# Patient Record
Sex: Male | Born: 1965 | Race: Black or African American | Hispanic: No | Marital: Married | Smoking: Current every day smoker
Health system: Southern US, Community
[De-identification: ages and names within clinical notes are randomized; demographics above are authoritative.]

## PROBLEM LIST (undated history)

## (undated) DIAGNOSIS — N289 Disorder of kidney and ureter, unspecified: Secondary | ICD-10-CM

## (undated) DIAGNOSIS — E119 Type 2 diabetes mellitus without complications: Secondary | ICD-10-CM

## (undated) DIAGNOSIS — G8929 Other chronic pain: Secondary | ICD-10-CM

## (undated) DIAGNOSIS — I10 Essential (primary) hypertension: Secondary | ICD-10-CM

## (undated) DIAGNOSIS — F4310 Post-traumatic stress disorder, unspecified: Secondary | ICD-10-CM

## (undated) DIAGNOSIS — K819 Cholecystitis, unspecified: Secondary | ICD-10-CM

## (undated) DIAGNOSIS — F329 Major depressive disorder, single episode, unspecified: Secondary | ICD-10-CM

## (undated) DIAGNOSIS — F209 Schizophrenia, unspecified: Secondary | ICD-10-CM

## (undated) DIAGNOSIS — F32A Depression, unspecified: Secondary | ICD-10-CM

## (undated) DIAGNOSIS — R413 Other amnesia: Secondary | ICD-10-CM

## (undated) DIAGNOSIS — J449 Chronic obstructive pulmonary disease, unspecified: Secondary | ICD-10-CM

## (undated) DIAGNOSIS — E785 Hyperlipidemia, unspecified: Secondary | ICD-10-CM

## (undated) DIAGNOSIS — G4700 Insomnia, unspecified: Secondary | ICD-10-CM

## (undated) HISTORY — DX: Hyperlipidemia, unspecified: E78.5

## (undated) HISTORY — PX: CERVICAL FUSION: SHX112

## (undated) HISTORY — DX: Essential (primary) hypertension: I10

## (undated) HISTORY — PX: SPINAL CORD STIMULATOR IMPLANT: SHX2422

## (undated) HISTORY — DX: Chronic obstructive pulmonary disease, unspecified: J44.9

## (undated) HISTORY — PX: LUMBAR FUSION: SHX111

---
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
## 2018-06-18 ENCOUNTER — Emergency Department (HOSPITAL_COMMUNITY): Payer: Medicare PPO

## 2018-06-18 ENCOUNTER — Observation Stay (HOSPITAL_COMMUNITY)
Admission: EM | Admit: 2018-06-18 | Discharge: 2018-06-19 | Disposition: A | Discharge status: Home / Self Care | Payer: Medicare PPO | Attending: Internal Medicine | Admitting: Internal Medicine

## 2018-06-18 ENCOUNTER — Other Ambulatory Visit: Payer: Self-pay

## 2018-06-18 ENCOUNTER — Encounter (HOSPITAL_COMMUNITY): Payer: Self-pay | Admitting: Emergency Medicine

## 2018-06-18 DIAGNOSIS — E785 Hyperlipidemia, unspecified: Secondary | ICD-10-CM | POA: Diagnosis not present

## 2018-06-18 DIAGNOSIS — F329 Major depressive disorder, single episode, unspecified: Secondary | ICD-10-CM | POA: Insufficient documentation

## 2018-06-18 DIAGNOSIS — E1310 Other specified diabetes mellitus with ketoacidosis without coma: Secondary | ICD-10-CM

## 2018-06-18 DIAGNOSIS — I10 Essential (primary) hypertension: Secondary | ICD-10-CM | POA: Insufficient documentation

## 2018-06-18 DIAGNOSIS — F4310 Post-traumatic stress disorder, unspecified: Secondary | ICD-10-CM | POA: Insufficient documentation

## 2018-06-18 DIAGNOSIS — Z794 Long term (current) use of insulin: Secondary | ICD-10-CM | POA: Diagnosis not present

## 2018-06-18 DIAGNOSIS — I4510 Unspecified right bundle-branch block: Secondary | ICD-10-CM | POA: Insufficient documentation

## 2018-06-18 DIAGNOSIS — R918 Other nonspecific abnormal finding of lung field: Secondary | ICD-10-CM | POA: Insufficient documentation

## 2018-06-18 DIAGNOSIS — F17210 Nicotine dependence, cigarettes, uncomplicated: Secondary | ICD-10-CM | POA: Diagnosis not present

## 2018-06-18 DIAGNOSIS — G8929 Other chronic pain: Secondary | ICD-10-CM | POA: Diagnosis not present

## 2018-06-18 DIAGNOSIS — F0390 Unspecified dementia without behavioral disturbance: Secondary | ICD-10-CM | POA: Insufficient documentation

## 2018-06-18 DIAGNOSIS — Z981 Arthrodesis status: Secondary | ICD-10-CM | POA: Insufficient documentation

## 2018-06-18 DIAGNOSIS — R413 Other amnesia: Secondary | ICD-10-CM | POA: Diagnosis present

## 2018-06-18 DIAGNOSIS — Z1159 Encounter for screening for other viral diseases: Secondary | ICD-10-CM | POA: Insufficient documentation

## 2018-06-18 DIAGNOSIS — R000 Tachycardia, unspecified: Secondary | ICD-10-CM | POA: Diagnosis not present

## 2018-06-18 DIAGNOSIS — Z79899 Other long term (current) drug therapy: Secondary | ICD-10-CM | POA: Insufficient documentation

## 2018-06-18 DIAGNOSIS — Z7951 Long term (current) use of inhaled steroids: Secondary | ICD-10-CM | POA: Insufficient documentation

## 2018-06-18 DIAGNOSIS — F209 Schizophrenia, unspecified: Secondary | ICD-10-CM | POA: Diagnosis not present

## 2018-06-18 DIAGNOSIS — R109 Unspecified abdominal pain: Secondary | ICD-10-CM | POA: Diagnosis present

## 2018-06-18 DIAGNOSIS — Z720 Tobacco use: Secondary | ICD-10-CM | POA: Diagnosis present

## 2018-06-18 DIAGNOSIS — K5900 Constipation, unspecified: Secondary | ICD-10-CM | POA: Insufficient documentation

## 2018-06-18 DIAGNOSIS — E1110 Type 2 diabetes mellitus with ketoacidosis without coma: Principal | ICD-10-CM | POA: Insufficient documentation

## 2018-06-18 DIAGNOSIS — G4700 Insomnia, unspecified: Secondary | ICD-10-CM | POA: Insufficient documentation

## 2018-06-18 DIAGNOSIS — E0810 Diabetes mellitus due to underlying condition with ketoacidosis without coma: Secondary | ICD-10-CM

## 2018-06-18 DIAGNOSIS — I152 Hypertension secondary to endocrine disorders: Secondary | ICD-10-CM | POA: Diagnosis present

## 2018-06-18 HISTORY — DX: Post-traumatic stress disorder, unspecified: F43.10

## 2018-06-18 HISTORY — DX: Other amnesia: R41.3

## 2018-06-18 HISTORY — DX: Type 2 diabetes mellitus without complications: E11.9

## 2018-06-18 HISTORY — DX: Insomnia, unspecified: G47.00

## 2018-06-18 HISTORY — DX: Other chronic pain: G89.29

## 2018-06-18 HISTORY — DX: Depression, unspecified: F32.A

## 2018-06-18 HISTORY — DX: Disorder of kidney and ureter, unspecified: N28.9

## 2018-06-18 HISTORY — DX: Schizophrenia, unspecified: F20.9

## 2018-06-18 HISTORY — DX: Cholecystitis, unspecified: K81.9

## 2018-06-18 LAB — URINALYSIS, ROUTINE W REFLEX MICROSCOPIC
Bacteria, UA: NONE SEEN
Bilirubin Urine: NEGATIVE
Glucose, UA: >=500 mg/dL — AB
Hgb urine dipstick: NEGATIVE
Ketones, ur: 20 mg/dL — AB
Leukocytes,Ua: NEGATIVE
Nitrite: NEGATIVE
Protein, ur: NEGATIVE mg/dL
Specific Gravity, Urine: 1.035 — ABNORMAL HIGH (ref 1.005–1.030)
pH: 5 (ref 5.0–8.0)

## 2018-06-18 LAB — GLUCOSE, CAPILLARY: Glucose-Capillary: 183 mg/dL — ABNORMAL HIGH (ref 70–99)

## 2018-06-18 LAB — POCT I-STAT EG7
Acid-base deficit: 1 mmol/L (ref 0.0–2.0)
Bicarbonate: 24.5 mmol/L (ref 20.0–28.0)
Calcium, Ion: 1.15 mmol/L (ref 1.15–1.40)
HCT: 41 % (ref 39.0–52.0)
Hemoglobin: 13.9 g/dL (ref 13.0–17.0)
O2 Saturation: 77 %
Potassium: 4.7 mmol/L (ref 3.5–5.1)
Sodium: 131 mmol/L — ABNORMAL LOW (ref 135–145)
TCO2: 26 mmol/L (ref 22–32)
pCO2, Ven: 41.7 mmHg — ABNORMAL LOW (ref 44.0–60.0)
pH, Ven: 7.378 (ref 7.250–7.430)
pO2, Ven: 43 mmHg (ref 32.0–45.0)

## 2018-06-18 LAB — CBG MONITORING, ED
Glucose-Capillary: 482 mg/dL — ABNORMAL HIGH (ref 70–99)
Glucose-Capillary: 508 mg/dL (ref 70–99)
Glucose-Capillary: 384 mg/dL — ABNORMAL HIGH (ref 70–99)
Glucose-Capillary: 290 mg/dL — ABNORMAL HIGH (ref 70–99)
Glucose-Capillary: 250 mg/dL — ABNORMAL HIGH (ref 70–99)
Glucose-Capillary: 222 mg/dL — ABNORMAL HIGH (ref 70–99)
Glucose-Capillary: 236 mg/dL — ABNORMAL HIGH (ref 70–99)

## 2018-06-18 LAB — COMPREHENSIVE METABOLIC PANEL
ALT: 23 U/L (ref 0–44)
ALT: 20 U/L (ref 0–44)
AST: 24 U/L (ref 15–41)
AST: 16 U/L (ref 15–41)
Albumin: 4.1 g/dL (ref 3.5–5.0)
Albumin: 3.4 g/dL — ABNORMAL LOW (ref 3.5–5.0)
Alkaline Phosphatase: 199 U/L — ABNORMAL HIGH (ref 38–126)
Alkaline Phosphatase: 158 U/L — ABNORMAL HIGH (ref 38–126)
Anion gap: 17 — ABNORMAL HIGH (ref 5–15)
Anion gap: 11 (ref 5–15)
BUN: 18 mg/dL (ref 6–20)
BUN: 14 mg/dL (ref 6–20)
CO2: 19 mmol/L — ABNORMAL LOW (ref 22–32)
CO2: 21 mmol/L — ABNORMAL LOW (ref 22–32)
Calcium: 9.3 mg/dL (ref 8.9–10.3)
Calcium: 8.5 mg/dL — ABNORMAL LOW (ref 8.9–10.3)
Chloride: 96 mmol/L — ABNORMAL LOW (ref 98–111)
Chloride: 100 mmol/L (ref 98–111)
Creatinine, Ser: 1.23 mg/dL (ref 0.61–1.24)
Creatinine, Ser: 0.78 mg/dL (ref 0.61–1.24)
GFR calc Af Amer: >60 mL/min (ref >60)
GFR calc Af Amer: >60 mL/min (ref >60)
GFR calc non Af Amer: >60 mL/min (ref >60)
GFR calc non Af Amer: >60 mL/min (ref >60)
Glucose, Bld: 488 mg/dL — ABNORMAL HIGH (ref 70–99)
Glucose, Bld: 260 mg/dL — ABNORMAL HIGH (ref 70–99)
Potassium: 4.9 mmol/L (ref 3.5–5.1)
Potassium: 3.6 mmol/L (ref 3.5–5.1)
Sodium: 132 mmol/L — ABNORMAL LOW (ref 135–145)
Sodium: 132 mmol/L — ABNORMAL LOW (ref 135–145)
Total Bilirubin: 1.3 mg/dL — ABNORMAL HIGH (ref 0.3–1.2)
Total Bilirubin: 0.5 mg/dL (ref 0.3–1.2)
Total Protein: 7.7 g/dL (ref 6.5–8.1)
Total Protein: 6.8 g/dL (ref 6.5–8.1)

## 2018-06-18 LAB — CBC
HCT: 41.1 % (ref 39.0–52.0)
Hemoglobin: 15 g/dL (ref 13.0–17.0)
MCH: 31.6 pg (ref 26.0–34.0)
MCHC: 36.5 g/dL — ABNORMAL HIGH (ref 30.0–36.0)
MCV: 86.7 fL (ref 80.0–100.0)
Platelets: 314 10*3/uL (ref 150–400)
RBC: 4.74 MIL/uL (ref 4.22–5.81)
RDW: 12.4 % (ref 11.5–15.5)
WBC: 11.3 10*3/uL — ABNORMAL HIGH (ref 4.0–10.5)
nRBC: 0 % (ref 0.0–0.2)

## 2018-06-18 LAB — SARS CORONAVIRUS 2 BY RT PCR (HOSPITAL ORDER, PERFORMED IN ~~LOC~~ HOSPITAL LAB): SARS Coronavirus 2: NEGATIVE

## 2018-06-18 LAB — LIPASE, BLOOD: Lipase: 34 U/L (ref 11–51)

## 2018-06-18 MED ORDER — SODIUM CHLORIDE 0.9 % IV BOLUS
1000.0000 mL | Freq: Once | INTRAVENOUS | Status: AC
  Administered 2018-06-18: 1000 mL via INTRAVENOUS

## 2018-06-18 MED ORDER — POTASSIUM CHLORIDE 10 MEQ/100ML IV SOLN
10.0000 meq | INTRAVENOUS | Status: AC
  Administered 2018-06-18 (×2): 10 meq via INTRAVENOUS
  Filled 2018-06-18 (×2): qty 100

## 2018-06-18 MED ORDER — INSULIN REGULAR(HUMAN) IN NACL 100-0.9 UT/100ML-% IV SOLN: INTRAVENOUS | Status: DC

## 2018-06-18 MED ORDER — DEXTROSE-NACL 5-0.45 % IV SOLN: INTRAVENOUS | Status: DC

## 2018-06-18 MED ORDER — DEXTROSE-NACL 5-0.45 % IV SOLN
INTRAVENOUS | Status: DC
  Administered 2018-06-18: 21:00:00 via INTRAVENOUS

## 2018-06-18 MED ORDER — FENTANYL CITRATE (PF) 100 MCG/2ML IJ SOLN
50.0000 ug | Freq: Once | INTRAMUSCULAR | Status: AC
  Administered 2018-06-18: 50 ug via INTRAVENOUS
  Filled 2018-06-18: qty 2

## 2018-06-18 MED ORDER — MORPHINE SULFATE (PF) 4 MG/ML IV SOLN
4.0000 mg | Freq: Once | INTRAVENOUS | Status: AC
  Administered 2018-06-18: 4 mg via INTRAVENOUS
  Filled 2018-06-18: qty 1

## 2018-06-18 MED ORDER — SODIUM CHLORIDE 0.9 % IV SOLN: INTRAVENOUS | Status: DC

## 2018-06-18 MED ORDER — ENOXAPARIN SODIUM 40 MG/0.4ML ~~LOC~~ SOLN
40.0000 mg | SUBCUTANEOUS | Status: DC
  Administered 2018-06-19: 40 mg via SUBCUTANEOUS
  Filled 2018-06-18: qty 0.4

## 2018-06-18 MED ORDER — SODIUM CHLORIDE 0.9 % IV SOLN
INTRAVENOUS | Status: DC
  Administered 2018-06-18: 18:00:00 via INTRAVENOUS

## 2018-06-18 MED ORDER — ONDANSETRON HCL 4 MG/2ML IJ SOLN
4.0000 mg | Freq: Once | INTRAMUSCULAR | Status: AC
  Administered 2018-06-18: 4 mg via INTRAVENOUS
  Filled 2018-06-18: qty 2

## 2018-06-18 MED ORDER — INSULIN REGULAR(HUMAN) IN NACL 100-0.9 UT/100ML-% IV SOLN
INTRAVENOUS | Status: DC
  Administered 2018-06-18: 4.5 [IU]/h via INTRAVENOUS
  Filled 2018-06-18: qty 100

## 2018-06-18 NOTE — ED Provider Notes (Signed)
[facility] EMERGENCY DEPARTMENT Provider Note   CSN: [id number] Arrival date & time: 06/18/18  1413    History   Chief Complaint Chief Complaint  Patient presents with   Abdominal Pain   Hyperglycemia    HPI Bryan Carlson is a 53 y.o. male with PMH/o Cholecystitis, Depression, Chronic pain, DM, Schizophrenia, PTSD who presents for evaluation of progressively worsening chronic abdominal pain.  Patient states he has had abdominal pain for the last 8 months.  He was previously seen in [place] where he got majority of his care.  Wife states that they they saw evidence of gallstones on his last scan about 5 months ago but had not talked about getting his gallbladder taken out.  Patient and his wife recently moved to [place] in March 2020.  They had not seen anybody for care because of COVID-19 pandemic.  Patient states that for the last month, he is felt like his abdominal pain has progressively worsened.  He states he takes Tylenol at home but does not have any improvement in pain.  Patient states that he has not had much of an appetite secondary to pain.  He reports that he has had nausea/vomiting for the last 8 months.  He states that most recent episode of vomiting was last night and that it was clear.  He does not know what color the vomit has been the other times.  Patient does not think he has had any coffee-ground emesis.  His last bowel movement was earlier this morning.  No presence of blood.  Patient states that he has also had some worsening lower back pain.  He has a history of chronic back pain and has had surgery.  He states that he has chronic numbness/weakness of his extremities as well as some chronic incontinence.  He states that there has been no new changes.  He has not noted any fevers.  Patient denies any chest pain, difficulty breathing.  Patient also reports he has been out of his diabetes medications for the last 2 weeks.  He reports he normally takes  metformin, glipizide and Lantus at home but states he has not had a chance to refill his medication since he ran out.  Patient denies any current chest pain, difficulty breathing, new numbness/weakness of his extremities, fevers.  He states that they have been self quarantine.  He does not know of any COVID-19 exposure.     The history is provided by the patient.    Past Medical History:  Diagnosis Date   Cholecystitis    Chronic pain    Depression    Diabetes mellitus without complication ([place])    Insomnia    Memory loss    PTSD (post-traumatic stress disorder)    Renal disorder    Schizophrenia [facility])     Patient Active Problem List   Diagnosis Date Noted   DKA (diabetic ketoacidoses) ([place]) 06/18/2018   Abdominal pain 06/18/2018   Essential hypertension 06/18/2018   Dementia ([place]) 06/18/2018    Past Surgical History:  Procedure Laterality Date   CERVICAL FUSION     LUMBAR FUSION          Home Medications    Prior to Admission medications   Medication Sig Start Date End Date Taking? Authorizing Provider  atorvastatin (LIPITOR) 40 MG tablet Take 40 mg by mouth daily.   Yes [provider]  cloNIDine (CATAPRES) 0.1 MG tablet Take 0.05 mg by mouth 2 (two) times daily.  Yes [provider]  cloZAPine (CLOZARIL) 100 MG tablet Take 400 mg by mouth at bedtime.   Yes [provider]  diphenhydrAMINE HCl 50 MG/30ML LIQD Take 50 mg by mouth at bedtime.   Yes [provider]  fluticasone (FLONASE) 50 MCG/ACT nasal spray Place 1 spray into both nostrils daily.   Yes [provider]  gabapentin (NEURONTIN) 800 MG tablet Take 800 mg by mouth 3 (three) times daily.   Yes [provider]  glipiZIDE (GLUCOTROL) 5 MG tablet Take 5 mg by mouth daily before breakfast.   Yes [provider]  insulin glargine (LANTUS) 100 unit/mL SOPN Inject 15 Units into the skin daily.   Yes [provider]    lisinopril (ZESTRIL) 5 MG tablet Take 5 mg by mouth daily.   Yes [provider]  Melatonin 10 MG TABS Take 20 mg by mouth at bedtime.   Yes [provider]  metFORMIN (GLUCOPHAGE) 1000 MG tablet Take 1,000 mg by mouth 2 (two) times daily with a meal.   Yes [provider]  Omega-3 Fatty Acids (FISH OIL PO) Take 1 capsule by mouth daily.   Yes [provider]  omeprazole (PRILOSEC) 40 MG capsule Take 40 mg by mouth daily.   Yes [provider]  prazosin (MINIPRESS) 5 MG capsule Take 5 mg by mouth at bedtime.   Yes [provider]  sertraline (ZOLOFT) 100 MG tablet Take 100 mg by mouth daily.   Yes [provider]  sertraline (ZOLOFT) 50 MG tablet Take 50 mg by mouth daily.   Yes [provider]  traZODone (DESYREL) 100 MG tablet Take 100 mg by mouth at bedtime.   Yes [provider]    Family History Family History  Problem Relation Age of Onset   Hypertension Mother    Diabetes Father     Social History Social History   Tobacco Use   Smoking status: Current Every Day Smoker    Packs/day: 1.50    Types: Cigarettes   Smokeless tobacco: Never Used  Substance Use Topics   Alcohol use: Not Currently   Drug use: Not Currently    Types: Cocaine     Allergies   Contrast media [iodinated diagnostic agents]   Review of Systems Review of Systems  Constitutional: Positive for appetite change. Negative for fever.  Respiratory: Negative for cough and shortness of breath.   Cardiovascular: Negative for chest pain.  Gastrointestinal: Positive for abdominal pain, nausea and vomiting.  Genitourinary: Negative for dysuria and hematuria.  Musculoskeletal: Positive for back pain.  Neurological: Positive for numbness (Chronic). Negative for headaches.  All other systems reviewed and are negative.    Physical Exam Updated Vital Signs BP 115/76    Pulse (!) 104    Temp 98.8 F (37.1 C) (Oral)     Resp 16    Ht _0  (1.753 m)    Wt 83.9 kg    SpO2 97%    BMI 27.32 kg/m   Physical Exam Vitals signs and nursing note reviewed.  Constitutional:      Appearance: Normal appearance. He is well-developed.  HENT:     Head: Normocephalic and atraumatic.  Eyes:     General: Lids are normal.     Conjunctiva/sclera: Conjunctivae normal.     Pupils: Pupils are equal, round, and reactive to light.  Neck:     Musculoskeletal: Full passive range of motion without pain.  Cardiovascular:     Rate and  Rhythm: Normal rate and regular rhythm.     Pulses: Normal pulses.          Radial pulses are 2+ on the right side and 2+ on the left side.       Dorsalis pedis pulses are 2+ on the right side and 2+ on the left side.     Heart sounds: Normal heart sounds. No murmur. No friction rub. No gallop.   Pulmonary:     Effort: Pulmonary effort is normal.     Breath sounds: Normal breath sounds.     Comments: No evidence of respiratory distress.  Able to speak in full sentences without any difficulty.  Diffuse rhonchi noted. Abdominal:     General: Bowel sounds are normal.     Palpations: Abdomen is soft. Abdomen is not rigid.     Tenderness: There is generalized abdominal tenderness and tenderness in the right upper quadrant. There is no guarding.     Comments: Abdomen is soft, nondistended.  Good bowel sounds.  He has some generalized tenderness diffusely throughout but most notably in the right upper quadrant.  No CVA tenderness.  Musculoskeletal: Normal range of motion.       Back:     Comments: Well-healed surgical incision scar noted to lower lumbar region.  Diffuse tenderness over the entire lumbar region.  No deformity or crepitus noted.  Skin:    General: Skin is warm and dry.     Capillary Refill: Capillary refill takes less than 2 seconds.  Neurological:     Mental Status: He is alert and oriented to person, place, and time.     Comments: Follows commands, Moves all extremities  5/5  strength to BUE and BLE  Decreased sensation noted to tips of bilateral fingers. Sensation intact throughout all major nerve distributions  Psychiatric:        Speech: Speech normal.      ED Treatments / Results  Labs (all labs ordered are listed, but only abnormal results are displayed) Labs Reviewed  COMPREHENSIVE METABOLIC PANEL - Abnormal; Notable for the following components:      Result Value   Sodium 132 (*)    Chloride 96 (*)    CO2 19 (*)    Glucose, Bld 488 (*)    Alkaline Phosphatase 199 (*)    Total Bilirubin 1.3 (*)    Anion gap 17 (*)    All other components within normal limits  CBC - Abnormal; Notable for the following components:   WBC 11.3 (*)    MCHC 36.5 (*)    All other components within normal limits  URINALYSIS, ROUTINE W REFLEX MICROSCOPIC - Abnormal; Notable for the following components:   Color, Urine STRAW (*)    Specific Gravity, Urine 1.035 (*)    Glucose, UA >=500 (*)    Ketones, ur 20 (*)    All other components within normal limits  CBG MONITORING, ED - Abnormal; Notable for the following components:   Glucose-Capillary 482 (*)    All other components within normal limits  CBG MONITORING, ED - Abnormal; Notable for the following components:   Glucose-Capillary 508 (*)    All other components within normal limits  POCT I-STAT EG7 - Abnormal; Notable for the following components:   pCO2, Ven 41.7 (*)    Sodium 131 (*)    All other components within normal limits  CBG MONITORING, ED - Abnormal; Notable for the following components:   Glucose-Capillary 384 (*)    All  other components within normal limits  CBG MONITORING, ED - Abnormal; Notable for the following components:   Glucose-Capillary 290 (*)    All other components within normal limits  CBG MONITORING, ED - Abnormal; Notable for the following components:   Glucose-Capillary 250 (*)    All other components within normal limits  CBG MONITORING, ED - Abnormal; Notable for the  following components:   Glucose-Capillary 222 (*)    All other components within normal limits  CBG MONITORING, ED - Abnormal; Notable for the following components:   Glucose-Capillary 236 (*)    All other components within normal limits  SARS CORONAVIRUS 2 (HOSPITAL ORDER, [facility])  LIPASE, BLOOD  COMPREHENSIVE METABOLIC PANEL    EKG None  Radiology Dg Chest 2 View  Result Date: 06/18/2018 CLINICAL DATA:  Hypertension.  Pain. EXAM: CHEST - 2 VIEW COMPARISON:  None. FINDINGS: Lungs are clear. Heart size and pulmonary vascularity are normal. No adenopathy. There is postoperative change in the lower cervical region. No pneumothorax. IMPRESSION: No edema or consolidation. Electronically Signed   By: Lowella Grip III M.D.   On: 06/18/2018 19:25   US Abdomen Limited Ruq  Result Date: 06/18/2018 CLINICAL DATA:  Upper abdominal pain EXAM: ULTRASOUND ABDOMEN LIMITED RIGHT UPPER QUADRANT COMPARISON:  None. FINDINGS: Gallbladder: No gallstones or wall thickening visualized. There is no pericholecystic fluid. No sonographic Murphy sign noted by sonographer. Common bile duct: Diameter: 8 mm proximally with tapering the 6 mm. No biliary duct mass or calculus. No evident intrahepatic biliary duct dilatation. Liver: No focal lesion identified. Within normal limits in parenchymal echogenicity. Portal vein is patent on color Doppler imaging with normal direction of blood flow towards the liver. IMPRESSION: Mildly prominent proximal common bile duct with mild tapering distally. No biliary duct mass or calculus evident by ultrasound. Study otherwise unremarkable. Electronically Signed   By: Lowella Grip III M.D.   On: 06/18/2018 19:24    Procedures .Critical Care Performed by: Volanda Napoleon, PA-C Authorized by: Volanda Napoleon, PA-C   Critical care provider statement:    Critical care time (minutes):  40   Critical care was necessary to treat or prevent  imminent or life-threatening deterioration of the following conditions:  Metabolic crisis   Critical care was time spent personally by me on the following activities:  Discussions with consultants, evaluation of patient's response to treatment, examination of patient, ordering and performing treatments and interventions, ordering and review of laboratory studies, ordering and review of radiographic studies, pulse oximetry, re-evaluation of patient's condition, obtaining history from patient or surrogate and review of old charts   (including critical care time)  Medications Ordered in ED Medications  insulin regular, human (MYXREDLIN) 100 units/ 100 mL infusion (7 Units/hr Intravenous Rate/Dose Change 06/18/18 2243)  sodium chloride 0.9 % bolus 1,000 mL (0 mLs Intravenous Stopped 06/18/18 1819)    And  0.9 %  sodium chloride infusion ( Intravenous Stopped 06/18/18 2114)  dextrose 5 %-0.45 % sodium chloride infusion ( Intravenous Transfusing/Transfer 06/18/18 2251)  morphine 4 MG/ML injection 4 mg (4 mg Intravenous Given 06/18/18 1733)  ondansetron (ZOFRAN) injection 4 mg (4 mg Intravenous Given 06/18/18 1733)  potassium chloride 10 mEq in 100 mL IVPB (0 mEq Intravenous Stopped 06/18/18 2114)  fentaNYL (SUBLIMAZE) injection 50 mcg (50 mcg Intravenous Given 06/18/18 2127)     Initial Impression / Assessment and Plan / ED Course  I have reviewed the triage vital signs and the nursing notes.  Pertinent labs & imaging results that were available during my care of the patient were reviewed by me and considered in my medical decision making (see chart for details).        53 year old male past history of cholelithiasis, diabetes, chronic pain who presents for evaluation of progressively worsening abdominal pain.  Also reports he has been out of his diabetes medications for 2 weeks.  No fevers, chest pain, difficulty breathing. Patient is afebrile, non-toxic appearing, sitting comfortably on examination  table. Vital signs reviewed and stable.  On exam, he has tenderness noted to the right upper quadrant.  Consider hepatobiliary etiology  COVID negative.  UA does show ketones and glucose.  CBC shows leukocytosis of 11.3.  CMP shows bicarb of 19, glucose of 488, alk phos of 199, total bili of 1.3, anion gap of 17.  No priors for comparison.  Workup consistent with mild DKA.  Lab work reassuring.  Abdomen benign with no peritoneal signs.  No indication for CT imaging of abdomen.  Chest x-ray negative for any infectious etiology.  Ultrasound shows no evidence of cholecystitis.  There is mention of a mildly prominent proximal common bile duct with mild tapering distally.  No evidence of any other abnormalities.  Blood glucose after fluids and insulin is 290.  Plan for admission for DKA. Discussed patient with Dr. Ellender Hose who is agreeable.   Discussed patient with Dr. Roel Cluck (hospitalist). Will admit.   Portions of this note were generated with [employer]. Dictation errors may occur despite best attempts at proofreading.   Final Clinical Impressions(s) / ED Diagnoses   Final diagnoses:  Abdominal pain  Diabetic ketoacidosis without coma associated with other specified diabetes mellitus [facility])    ED Discharge Orders    None       Desma Mcgregor 06/18/18 2311    Duffy Bruce, MD 06/21/18 1019

## 2018-06-18 NOTE — ED Notes (Signed)
Patient transported to Ultrasound 

## 2018-06-18 NOTE — ED Notes (Signed)
ED Provider at bedside. 

## 2018-06-18 NOTE — ED Triage Notes (Signed)
Pt reports being out of his diabetes medication going on 2 weeks. Pt also states he is having a gallbladder flare up.

## 2018-06-18 NOTE — Progress Notes (Signed)
Pt admitted to 4E04. CHG bath completed, tele applied. Pt c/o tender stomach. Pt oriented to room, call bell and phone within reach.   Pt c/o intermittent CP; 4/10. EKG obtained. MD paged. Will continue to monitor.   Reynold Bowen, RN BSN 06/18/2018 11:45 PM

## 2018-06-18 NOTE — H&P (Addendum)
Bryan Carlson QQP:[id number] DOB: 07-03-1965 DOA: 06/18/2018     PCP: System, Pcp Not In   Outpatient Specialists: NONE    Patient arrived to ER on 06/18/18 at 1413  Patient coming from: home Lives  With family    Chief Complaint:  Chief Complaint  Patient presents with   Abdominal Pain   Hyperglycemia    HPI: Bryan Carlson is a 53 y.o. male with medical history significant of DM2   Cholecystitis, Depression, Chronic pain Schizophrenia, PTSD   Presented with   for evaluation of progressively worsening chronic abdominal pain No abdominal pain for 8 months in [place] he had a ultrasound done about 5 months ago that showed gallstones.  Patient his wife moved to [place] in March 2020 and has not been seen anybody for this since then secondary to pandemic. Have not established care The pain has been getting progressively worse To take Tylenol but does not seem to help. Have any worsening appetite  Has been having intermittent nausea vomitng and abdominal pain reports abdominal distention and constipation  Fever  or shortness of breath  He has been out of his diabetes medications for the last 2 weeks He is supposed to be on metformin and glipizide and Lantus Reports dizziness,    chest pain after cough  no shortness of breath, no fever Cough that has been chronicn  Infectious risk factors:  Reports   N/V  In RAPID COVID TEST NEGATIVE     Regarding pertinent Chronic problems:     Hyperlipidemia -  Not on statins   HTN on clonidine    DM 2 - on insulin, PO meds         While in ER: Noted to have bicarb 19 glucose 488 anion gap of 17 ketones in the urine diagnosis of DKA Chest x-ray unremarkable ultrasound showing no cholecystitis He was given IV fluids and insulin his blood sugar came down to 290  The following Work up has been ordered so far:  Orders Placed This Encounter  Procedures   Critical Care   SARS Coronavirus 2 (CEPHEID - Performed in [place] hospital lab), Hosp Order   US Abdomen Limited RUQ   DG Chest 2 View   CT ABDOMEN PELVIS WO CONTRAST   Lipase, blood   Comprehensive metabolic panel   CBC   Urinalysis, Routine w reflex microscopic   Comprehensive metabolic panel   HIV antibody (Routine Testing)   Basic metabolic panel   Beta-hydroxybutyric acid   Urine rapid drug screen (hosp performed)not at [facility]   Hemoglobin A1c   Glucose, capillary   Magnesium   Phosphorus   TSH   Comprehensive metabolic panel   CBC   Glucose, capillary   Diet NPO time specified   Saline Lock IV, Maintain IV access   Saline Lock IV, Maintain IV access   Cardiac monitoring   Initiate Carrier Fluid Protocol   K+  > 5 mEq/L = no potassium   Vital signs   Cardiac monitoring   Notify physician   Bed rest with bathroom privileges   Please refer to DKA Protocol sidebar report   Please refer to Hypoglycemia sidebar report   Strict intake and output   Initiate Oral Care Protocol   Initiate Carrier Fluid Protocol   RN may order General Admission PRN Orders utilizing "General Admission PRN medications" (through manage orders) for the following patient needs: allergy symptoms (Claritin), cold sores (Carmex), cough (Robitussin DM), eye irritation (Liquifilm  Tears), hemorrhoids (Tucks), indigestion (Maalox), minor skin irritation (Hydrocortisone Cream), muscle pain Suezanne Jacquet Gay), nose irritation (saline nasal spray) and sore throat (Chloraseptic spray).   IV bolus initiated in ED   K+  > 5 mEq/L = no potassium   Vital signs   Notify physician   Up with assistance   If patient diabetic or glucose greater than 140 notify physician for Sliding Scale Insulin Orders   May go off telemetry for tests/procedures   Oral care per nursing protocol   Initiate Oral Care Protocol   Initiate Carrier Fluid Protocol   RN may order General Admission PRN Orders utilizing "General Admission PRN medications" (through  manage orders) for the following patient needs: allergy symptoms (Claritin), cold sores (Carmex), cough (Robitussin DM), eye irritation (Liquifilm Tears), hemorrhoids (Tucks), indigestion (Maalox), minor skin irritation (Hydrocortisone Cream), muscle pain Suezanne Jacquet Gay), nose irritation (saline nasal spray) and sore throat (Chloraseptic spray).   Patient has an active order for admit to inpatient/place in observation   Full code   Consult for [facility] Admission   Consult for [facility] Admission   Pulse oximetry, continuous   Pulse oximetry check with vital signs   Oxygen therapy Mode or (Route): Nasal cannula; Liters Per Minute: 2; Keep 02 saturation: greater than 92 %   Incentive spirometry   CBG monitoring, ED   CBG monitoring, ED   POCT I-Stat EG7   CBG monitoring, ED   CBG monitoring, ED   CBG monitoring, ED   CBG monitoring, ED   CBG monitoring, ED   EKG 12-Lead   EKG 12-Lead   EKG 12-Lead   EKG 12-Lead   EKG 12-Lead   Place in observation (patient's expected length of stay will be less than 2 midnights)   Following Medications were ordered in ER: Medications  insulin regular, human (MYXREDLIN) 100 units/ 100 mL infusion (6.2 Units/hr Intravenous Handoff 06/19/18 0012)  sodium chloride 0.9 % bolus 1,000 mL (0 mLs Intravenous Stopped 06/18/18 1819)    And  0.9 %  sodium chloride infusion ( Intravenous Stopped 06/18/18 2114)  dextrose 5 %-0.45 % sodium chloride infusion ( Intravenous Transfusing/Transfer 06/18/18 2251)  0.9 %  sodium chloride infusion ( Intravenous Not Given 06/18/18 2344)  enoxaparin (LOVENOX) injection 40 mg (40 mg Subcutaneous Given 06/19/18 0046)  dextrose 5 %-0.45 % sodium chloride infusion ( Intravenous Transfusing/Transfer 06/18/18 2335)  atorvastatin (LIPITOR) tablet 40 mg (has no administration in time range)  cloNIDine (CATAPRES) tablet 0.05 mg (has no administration in time range)  lisinopril (ZESTRIL) tablet 5 mg (has no  administration in time range)  cloZAPine (CLOZARIL) tablet 400 mg (has no administration in time range)  sertraline (ZOLOFT) tablet 100 mg (has no administration in time range)  sertraline (ZOLOFT) tablet 50 mg (has no administration in time range)  traZODone (DESYREL) tablet 100 mg (has no administration in time range)  pantoprazole (PROTONIX) EC tablet 40 mg (has no administration in time range)  gabapentin (NEURONTIN) capsule 800 mg (has no administration in time range)  acetaminophen (TYLENOL) tablet 650 mg (has no administration in time range)    Or  acetaminophen (TYLENOL) suppository 650 mg (has no administration in time range)  ondansetron (ZOFRAN) tablet 4 mg (has no administration in time range)    Or  ondansetron (ZOFRAN) injection 4 mg (has no administration in time range)  docusate sodium (COLACE) capsule 100 mg (has no administration in time range)  bisacodyl (DULCOLAX) suppository 10 mg (has no administration in time range)  morphine  4 MG/ML injection 4 mg (4 mg Intravenous Given 06/18/18 1733)  ondansetron (ZOFRAN) injection 4 mg (4 mg Intravenous Given 06/18/18 1733)  potassium chloride 10 mEq in 100 mL IVPB (0 mEq Intravenous Stopped 06/18/18 2114)  fentaNYL (SUBLIMAZE) injection 50 mcg (50 mcg Intravenous Given 06/18/18 2127)       Significant initial  Findings: Abnormal Labs Reviewed  COMPREHENSIVE METABOLIC PANEL - Abnormal; Notable for the following components:      Result Value   Sodium 132 (*)    Chloride 96 (*)    CO2 19 (*)    Glucose, Bld 488 (*)    Alkaline Phosphatase 199 (*)    Total Bilirubin 1.3 (*)    Anion gap 17 (*)    All other components within normal limits  CBC - Abnormal; Notable for the following components:   WBC 11.3 (*)    MCHC 36.5 (*)    All other components within normal limits  URINALYSIS, ROUTINE W REFLEX MICROSCOPIC - Abnormal; Notable for the following components:   Color, Urine STRAW (*)    Specific Gravity, Urine 1.035 (*)     Glucose, UA >=500 (*)    Ketones, ur 20 (*)    All other components within normal limits  COMPREHENSIVE METABOLIC PANEL - Abnormal; Notable for the following components:   Sodium 132 (*)    CO2 21 (*)    Glucose, Bld 260 (*)    Calcium 8.5 (*)    Albumin 3.4 (*)    Alkaline Phosphatase 158 (*)    All other components within normal limits  BASIC METABOLIC PANEL - Abnormal; Notable for the following components:   Potassium 3.4 (*)    CO2 21 (*)    Glucose, Bld 135 (*)    Calcium 8.7 (*)    All other components within normal limits  RAPID URINE DRUG SCREEN, HOSP PERFORMED - Abnormal; Notable for the following components:   Opiates POSITIVE (*)    All other components within normal limits  HEMOGLOBIN A1C - Abnormal; Notable for the following components:   Hgb A1c MFr Bld 11.6 (*)    All other components within normal limits  GLUCOSE, CAPILLARY - Abnormal; Notable for the following components:   Glucose-Capillary 183 (*)    All other components within normal limits  GLUCOSE, CAPILLARY - Abnormal; Notable for the following components:   Glucose-Capillary 102 (*)    All other components within normal limits  CBG MONITORING, ED - Abnormal; Notable for the following components:   Glucose-Capillary 482 (*)    All other components within normal limits  CBG MONITORING, ED - Abnormal; Notable for the following components:   Glucose-Capillary 508 (*)    All other components within normal limits  POCT I-STAT EG7 - Abnormal; Notable for the following components:   pCO2, Ven 41.7 (*)    Sodium 131 (*)    All other components within normal limits  CBG MONITORING, ED - Abnormal; Notable for the following components:   Glucose-Capillary 384 (*)    All other components within normal limits  CBG MONITORING, ED - Abnormal; Notable for the following components:   Glucose-Capillary 290 (*)    All other components within normal limits  CBG MONITORING, ED - Abnormal; Notable for the following  components:   Glucose-Capillary 250 (*)    All other components within normal limits  CBG MONITORING, ED - Abnormal; Notable for the following components:   Glucose-Capillary 222 (*)    All other components within  normal limits  CBG MONITORING, ED - Abnormal; Notable for the following components:   Glucose-Capillary 236 (*)    All other components within normal limits     Otherwise labs showing:    Recent Labs  Lab 06/18/18 1449 06/18/18 1743 06/18/18 2233 06/19/18 0003  NA 132* 131* 132* 135  K 4.9 4.7 3.6 3.4*  CO2 19*  --  21* 21*  GLUCOSE 488*  --  260* 135*  BUN 18  --  14 14  CREATININE 1.23  --  0.78 0.81  CALCIUM 9.3  --  8.5* 8.7*    Cr   stable,   Lab Results  Component Value Date   CREATININE 0.81 06/19/2018   CREATININE 0.78 06/18/2018   CREATININE 1.23 06/18/2018    Recent Labs  Lab 06/18/18 1449 06/18/18 2233  AST 24 16  ALT 23 20  ALKPHOS 199* 158*  BILITOT 1.3* 0.5  PROT 7.7 6.8  ALBUMIN 4.1 3.4*   Lab Results  Component Value Date   CALCIUM 8.7 (L) 06/19/2018      WBC      Component Value Date/Time   WBC 11.3 (H) 06/18/2018 1449     Plt: Lab Results  Component Value Date   PLT 314 06/18/2018       Venous  Blood Gas result:  pH 7.378  PCO2 41.7   ABG    Component Value Date/Time   HCO3 24.5 06/18/2018 1743   TCO2 26 06/18/2018 1743   ACIDBASEDEF 1.0 06/18/2018 1743   O2SAT 77.0 06/18/2018 1743      HG/HCT  Stable,     Component Value Date/Time   HGB 13.9 06/18/2018 1743   HCT 41.0 06/18/2018 1743    Recent Labs  Lab 06/18/18 1449  LIPASE 34       DM  labs:  HbA1C: Recent Labs    06/19/18 0003  HGBA1C 11.6*       CBG (last 3)  Recent Labs    06/18/18 2239 06/18/18 2342 06/19/18 0046  GLUCAP 236* 183* 102*      UA  no evidence of UTI     Urine analysis:    Component Value Date/Time   COLORURINE STRAW (A) 06/18/2018 1730   APPEARANCEUR CLEAR 06/18/2018 1730   LABSPEC 1.035 (H) 06/18/2018  1730   PHURINE 5.0 06/18/2018 1730   GLUCOSEU >=500 (A) 06/18/2018 1730   HGBUR NEGATIVE 06/18/2018 1730   BILIRUBINUR NEGATIVE 06/18/2018 1730   KETONESUR 20 (A) 06/18/2018 1730   PROTEINUR NEGATIVE 06/18/2018 1730   NITRITE NEGATIVE 06/18/2018 1730   LEUKOCYTESUR NEGATIVE 06/18/2018 1730      CXR - NON acute    [place] not acute  ECG:  Personally reviewed by me showing: HR : 102 Rhythm  Sinus tachycardia    , no evidence of ischemic changes QTC 471      ED Triage Vitals  Enc Vitals Group     BP 06/18/18 1439 132/84     Pulse Rate 06/18/18 1439 (!) 121     Resp 06/18/18 1439 20     Temp 06/18/18 1439 97.7 F (36.5 C)     Temp Source 06/18/18 1439 Oral     SpO2 06/18/18 1439 100 %     Weight 06/18/18 1444 185 lb (83.9 kg)     Height 06/18/18 1444 '5\' 9"'$  (1.753 m)     Head Circumference --      Peak Flow --      Pain Score 06/18/18  1440 8     Pain Loc --      Pain Edu? --      Excl. in [place]? --   TMAX(24)@       Latest  Blood pressure 103/71, pulse (!) 103, temperature 98.8 F (37.1 C), temperature source Oral, resp. rate 12, height '5\' 9"'$  (1.753 m), weight 83.9 kg, SpO2 99 %.    Hospitalist was called for admission for DKA   Review of Systems:    Pertinent positives include: fatigue, abdominal pain, nausea, vomiting  Constitutional:  No weight loss, night sweats, Fevers, chills, weight loss  HEENT:  No headaches, Difficulty swallowing,Tooth/dental problems,Sore throat,  No sneezing, itching, ear ache, nasal congestion, post nasal drip,  Cardio-vascular:  No chest pain, Orthopnea, PND, anasarca, dizziness, palpitations.no Bilateral lower extremity swelling  GI:  No heartburn, indigestion, , diarrhea, change in bowel habits, loss of appetite, melena, blood in stool, hematemesis Resp:  no shortness of breath at rest. No dyspnea on exertion, No excess mucus, no productive cough, No non-productive cough, No coughing up of blood.No change in color of mucus.No  wheezing. Skin:  no rash or lesions. No jaundice GU:  no dysuria, change in color of urine, no urgency or frequency. No straining to urinate.  No flank pain.  Musculoskeletal:  No joint pain or no joint swelling. No decreased range of motion. No back pain.  Psych:  No change in mood or affect. No depression or anxiety. No memory loss.  Neuro: no localizing neurological complaints, no tingling, no weakness, no double vision, no gait abnormality, no slurred speech, no confusion  All systems reviewed and apart from [place] all are negative  Past Medical History:   Past Medical History:  Diagnosis Date   Cholecystitis    Chronic pain    Depression    Diabetes mellitus without complication (HCC)    Insomnia    Memory loss    PTSD (post-traumatic stress disorder)    Renal disorder    Schizophrenia ([place])       Past Surgical History:  Procedure Laterality Date   CERVICAL FUSION     LUMBAR FUSION      Social History:  Ambulatory   Independently      reports that he has been smoking cigarettes. He has been smoking about 1.50 packs per day. He has never used smokeless tobacco. He reports previous alcohol use. He reports previous drug use. Drug: Cocaine.     Family History:   Family History  Problem Relation Age of Onset   Hypertension Mother    Diabetes Father     Allergies: Allergies  Allergen Reactions   Contrast Media [Iodinated Diagnostic Agents] Hives     Prior to Admission medications   Not on File   Physical Exam: Blood pressure 103/71, pulse (!) 103, temperature 98.8 F (37.1 C), temperature source Oral, resp. rate 12, height '5\' 9"'$  (1.753 m), weight 83.9 kg, SpO2 99 %. 1. General:  in No Acute distress    Chronically ill  -appearing 2. Psychological: Alert and   Oriented 3. Head/ENT:     Dry Mucous Membranes                          Head Non traumatic, neck supple                            Poor Dentition 4. SKIN:  decreased Skin turgor,  Skin clean Dry and intact no rash 5. Heart: Regular rate and rhythm no  Murmur, no Rub or gallop 6. Lungs:  Clear to auscultation bilaterally, no wheezes or crackles   7. Abdomen: Soft, generalized tenderness,    distended   Obese bowel sounds present 8. Lower extremities: no clubbing, cyanosis, no  edema 9. Neurologically Grossly intact, moving all 4 extremities equally  10. MSK: Normal range of motion   All other LABS:     Recent Labs  Lab 06/18/18 1449 06/18/18 1743  WBC 11.3*  --   HGB 15.0 13.9  HCT 41.1 41.0  MCV 86.7  --   PLT 314  --      Recent Labs  Lab 06/18/18 1449 06/18/18 1743 06/18/18 2233 06/19/18 0003  NA 132* 131* 132* 135  K 4.9 4.7 3.6 3.4*  CL 96*  --  100 105  CO2 19*  --  21* 21*  GLUCOSE 488*  --  260* 135*  BUN 18  --  14 14  CREATININE 1.23  --  0.78 0.81  CALCIUM 9.3  --  8.5* 8.7*     Recent Labs  Lab 06/18/18 1449 06/18/18 2233  AST 24 16  ALT 23 20  ALKPHOS 199* 158*  BILITOT 1.3* 0.5  PROT 7.7 6.8  ALBUMIN 4.1 3.4*       Cultures: No results found for: SDES, SPECREQUEST, CULT, REPTSTATUS   Radiological Exams on Admission: Dg Chest 2 View  Result Date: 06/18/2018 CLINICAL DATA:  Hypertension.  Pain. EXAM: CHEST - 2 VIEW COMPARISON:  None. FINDINGS: Lungs are clear. Heart size and pulmonary vascularity are normal. No adenopathy. There is postoperative change in the lower cervical region. No pneumothorax. IMPRESSION: No edema or consolidation. Electronically Signed   By: Lowella Grip III M.D.   On: 06/18/2018 19:25   US Abdomen Limited Ruq  Result Date: 06/18/2018 CLINICAL DATA:  Upper abdominal pain EXAM: ULTRASOUND ABDOMEN LIMITED RIGHT UPPER QUADRANT COMPARISON:  None. FINDINGS: Gallbladder: No gallstones or wall thickening visualized. There is no pericholecystic fluid. No sonographic Murphy sign noted by sonographer. Common bile duct: Diameter: 8 mm proximally with tapering the 6 mm. No biliary duct mass or calculus.  No evident intrahepatic biliary duct dilatation. Liver: No focal lesion identified. Within normal limits in parenchymal echogenicity. Portal vein is patent on color Doppler imaging with normal direction of blood flow towards the liver. IMPRESSION: Mildly prominent proximal common bile duct with mild tapering distally. No biliary duct mass or calculus evident by ultrasound. Study otherwise unremarkable. Electronically Signed   By: Lowella Grip III M.D.   On: 06/18/2018 19:24    Chart has been reviewed    Assessment/Plan   53 y.o. male with medical history significant of DM2   Cholecystitis, Depression, Chronic pain Schizophrenia, PTSD Admitted for DKA in the setting of noncompliance with home insulin regimen  Present on Admission:  DKA (diabetic ketoacidoses) ([place]) -mild anticipate be able to transition to home insulin regimen.  Continue serial B met IV fluid resuscitation order diabetes coordinator consult given elevated hemoglobin A1c   Abdominal pain -ultrasound nonconclusive but no evidence of cholecystitis.  Given diffuse abdominal pain constipation and weight loss will obtain CT of abdomen to further evaluate for any other underlying abnormality   Essential hypertension -stable continue home medications  Dementia (HCC)-mild patient currently stable we will continue to monitor may have some degree of sundowning  History of depression - continue home medication  Tobacco abuse -  -  Spoke about importance of quitting spent 5 minutes discussing options for treatment, prior attempts at quitting, and dangers of smoking  -At this point patient is    interested in quitting  - order nicotine patch   - nursing tobacco cessation protocol  Other plan as per orders.  DVT prophylaxis:    Lovenox     Code Status:  FULL CODE   Family Communication:   Family not at  Bedside    Disposition Plan:    To home once workup is complete and patient is stable                                     Diabetes coordinator Consults called: none  Admission status:  ED Disposition    ED Disposition Condition [place]: [place] [[id number]]  Level of Care: Progressive [102]  I expect the patient will be discharged within 24 hours: No (not a candidate for 5C-Observation unit)  Covid Evaluation: N/A  Diagnosis: DKA (diabetic ketoacidoses) ([place]) [[id number]]  Admitting Physician: Toy Baker [[id number]]  Attending Physician: Toy Baker [[id number]]  PT Class (Do Not Modify): Observation [104]  PT Acc Code (Do Not Modify): Observation [10022]         Obs    Level of care       SDU tele indefinitely please discontinue once patient no longer qualifies  Precautions:  No active isolations  PPE: Used by the provider:   P100  eye Goggles,  Gloves      Kirandeep Fariss 06/19/2018, 1:01 AM    [facility]     after 2 AM please page floor coverage PA If 7AM-7PM, please contact the day team taking care of the patient using [email or web address]

## 2018-06-18 NOTE — ED Notes (Signed)
ED TO INPATIENT HANDOFF REPORT  ED Nurse Name and Phone #: Elmarie Shileyiffany [phone]  S Name/Age/Gender Bryan Labrumavid Novacek 53 y.o. male Room/Bed: 028C/028C  Code Status   Code Status: Not on file  Home/SNF/Other Patient oriented to: self, place, time and situation Is this baseline? Yes   Triage Complete: Triage complete  Chief Complaint blood sugar high/gallbladder  Triage Note Pt reports being out of his diabetes medication going on 2 weeks. Pt also states he is having a gallbladder flare up.    Allergies Allergies  Allergen Reactions  . Contrast Media [Iodinated Diagnostic Agents] Hives    Level of Care/Admitting Diagnosis ED Disposition    ED Disposition Condition Comment   Admit  [facility] [[id number]]  Level of Care: Progressive [102]  I expect the patient will be discharged within 24 hours: No (not a candidate for 5C-Observation unit)  Covid Evaluation: N/A  Diagnosis: DKA (diabetic ketoacidoses) (HCC) [454098][193956]  Admitting Physician: Therisa DoyneUTOVA, ANASTASSIA [[id number]]  Attending Physician: Therisa DoyneUTOVA, ANASTASSIA [[id number]]  PT Class (Do Not Modify): Observation [104]  PT Acc Code (Do Not Modify): Observation [10022]       B Medical/Surgery History Past Medical History:  Diagnosis Date  . Cholecystitis   . Chronic pain   . Depression   . Diabetes mellitus without complication (HCC)   . Insomnia   . Memory loss   . PTSD (post-traumatic stress disorder)   . Renal disorder   . Schizophrenia [facility](HCC)    Past Surgical History:  Procedure Laterality Date  . CERVICAL FUSION    . LUMBAR FUSION       A IV Location/Drains/Wounds Patient Lines/Drains/Airways Status   Active Line/Drains/Airways    Name:   Placement date:   Placement time:   Site:   Days:   Peripheral IV 06/18/18 Right Antecubital   06/18/18    1657    Antecubital   less than 1   Peripheral IV 06/18/18 Left Antecubital   06/18/18    2041    Antecubital   less than 1           Intake/Output Last 24 hours  Intake/Output Summary (Last 24 hours) at 06/18/2018 2207 Last data filed at 06/18/2018 2114 Gross per 24 hour  Intake 849.35 ml  Output -  Net 849.35 ml    Labs/Imaging Results for orders placed or performed during the hospital encounter of 06/18/18 (from the past 48 hour(s))  CBG monitoring, ED     Status: Abnormal   Collection Time: 06/18/18  2:39 PM  Result Value Ref Range   Glucose-Capillary 482 (H) 70 - 99 mg/dL   Comment 1 Notify RN    Comment 2 Document in Chart   Lipase, blood     Status: None   Collection Time: 06/18/18  2:49 PM  Result Value Ref Range   Lipase 34 11 - 51 U/L    Comment: Performed at [facility], [address] [place], [place] [address]  Comprehensive metabolic panel     Status: Abnormal   Collection Time: 06/18/18  2:49 PM  Result Value Ref Range   Sodium 132 (L) 135 - 145 mmol/L    Comment: POST-ULTRACENTRIFUGATION   Potassium 4.9 3.5 - 5.1 mmol/L    Comment: HEMOLYSIS AT THIS LEVEL MAY AFFECT RESULT   Chloride 96 (L) 98 - 111 mmol/L   CO2 19 (L) 22 - 32 mmol/L   Glucose, Bld 488 (H) 70 - 99 mg/dL   BUN 18  6 - 20 mg/dL   Creatinine, Ser 9.60 0.61 - 1.24 mg/dL   Calcium 9.3 8.9 - 45.4 mg/dL   Total Protein 7.7 6.5 - 8.1 g/dL   Albumin 4.1 3.5 - 5.0 g/dL   AST 24 15 - 41 U/L   ALT 23 0 - 44 U/L   Alkaline Phosphatase 199 (H) 38 - 126 U/L   Total Bilirubin 1.3 (H) 0.3 - 1.2 mg/dL   GFR calc non Af Amer >60 >60 mL/min   GFR calc Af Amer >60 >60 mL/min   Anion gap 17 (H) 5 - 15    Comment: Performed at [facility], [address] [place], [place] [address]  CBC     Status: Abnormal   Collection Time: 06/18/18  2:49 PM  Result Value Ref Range   WBC 11.3 (H) 4.0 - 10.5 K/uL   RBC 4.74 4.22 - 5.81 MIL/uL   Hemoglobin 15.0 13.0 - 17.0 g/dL   HCT 91.4 78.2 - 95.6 %   MCV 86.7 80.0 - 100.0 fL   MCH 31.6 26.0 - 34.0 pg   MCHC 36.5 (H) 30.0 - 36.0 g/dL   RDW 21.3 08.6 - 57.8 %   Platelets 314 150  - 400 K/uL   nRBC 0.0 0.0 - 0.2 %    Comment: Performed at [facility], [address], [place], [place] [address]  CBG monitoring, ED     Status: Abnormal   Collection Time: 06/18/18  5:13 PM  Result Value Ref Range   Glucose-Capillary 508 (HH) 70 - 99 mg/dL   Comment 1 Notify RN   Urinalysis, Routine w reflex microscopic     Status: Abnormal   Collection Time: 06/18/18  5:30 PM  Result Value Ref Range   Color, Urine STRAW (A) YELLOW   APPearance CLEAR CLEAR   Specific Gravity, Urine 1.035 (H) 1.005 - 1.030   pH 5.0 5.0 - 8.0   Glucose, UA >=500 (A) NEGATIVE mg/dL   Hgb urine dipstick NEGATIVE NEGATIVE   Bilirubin Urine NEGATIVE NEGATIVE   Ketones, ur 20 (A) NEGATIVE mg/dL   Protein, ur NEGATIVE NEGATIVE mg/dL   Nitrite NEGATIVE NEGATIVE   Leukocytes,Ua NEGATIVE NEGATIVE   RBC / HPF 0-5 0 - 5 RBC/hpf   WBC, UA 0-5 0 - 5 WBC/hpf   Bacteria, UA NONE SEEN NONE SEEN    Comment: Performed at [facility], [address], [place], [place] [address]  POCT I-Stat EG7     Status: Abnormal   Collection Time: 06/18/18  5:43 PM  Result Value Ref Range   pH, Ven 7.378 7.250 - 7.430   pCO2, Ven 41.7 (L) 44.0 - 60.0 mmHg   pO2, Ven 43.0 32.0 - 45.0 mmHg   Bicarbonate 24.5 20.0 - 28.0 mmol/L   TCO2 26 22 - 32 mmol/L   O2 Saturation 77.0 %   Acid-base deficit 1.0 0.0 - 2.0 mmol/L   Sodium 131 (L) 135 - 145 mmol/L   Potassium 4.7 3.5 - 5.1 mmol/L   Calcium, Ion 1.15 1.15 - 1.40 mmol/L   HCT 41.0 39.0 - 52.0 %   Hemoglobin 13.9 13.0 - 17.0 g/dL   Patient temperature HIDE    Sample type VENOUS   SARS Coronavirus 2 (CEPHEID - Performed in [facility] hospital lab), Hosp Order     Status: None   Collection Time: 06/18/18  5:49 PM  Result Value Ref Range   SARS Coronavirus 2 NEGATIVE NEGATIVE  Comment: (NOTE) If result is NEGATIVE SARS-CoV-2 target nucleic acids are NOT DETECTED. The SARS-CoV-2 RNA is generally detectable in upper and lower  respiratory specimens during  the acute phase of infection. The lowest  concentration of SARS-CoV-2 viral copies this assay can detect is 250  copies / mL. A negative result does not preclude SARS-CoV-2 infection  and should not be used as the sole basis for treatment or other  patient management decisions.  A negative result may occur with  improper specimen collection / handling, submission of specimen other  than nasopharyngeal swab, presence of viral mutation(s) within the  areas targeted by this assay, and inadequate number of viral copies  (<250 copies / mL). A negative result must be combined with clinical  observations, patient history, and epidemiological information. If result is POSITIVE SARS-CoV-2 target nucleic acids are DETECTED. The SARS-CoV-2 RNA is generally detectable in upper and lower  respiratory specimens dur ing the acute phase of infection.  Positive  results are indicative of active infection with SARS-CoV-2.  Clinical  correlation with patient history and other diagnostic information is  necessary to determine patient infection status.  Positive results do  not rule out bacterial infection or co-infection with other viruses. If result is PRESUMPTIVE POSTIVE SARS-CoV-2 nucleic acids MAY BE PRESENT.   A presumptive positive result was obtained on the submitted specimen  and confirmed on repeat testing.  While 2019 novel coronavirus  (SARS-CoV-2) nucleic acids may be present in the submitted sample  additional confirmatory testing may be necessary for epidemiological  and / or clinical management purposes  to differentiate between  SARS-CoV-2 and other Sarbecovirus currently known to infect humans.  If clinically indicated additional testing with an alternate test  methodology [phone]) is advised. The SARS-CoV-2 RNA is generally  detectable in upper and lower respiratory sp ecimens during the acute  phase of infection. The expected result is Negative. Fact Sheet for Patients:   BoilerBrush.com.cy Fact Sheet for Healthcare Providers: https://pope.com/ This test is not yet approved or cleared by the [place] FDA and has been authorized for detection and/or diagnosis of SARS-CoV-2 by FDA under an Emergency Use Authorization (EUA).  This EUA will remain in effect (meaning this test can be used) for the duration of the COVID-19 declaration under Section 564(b)(1) of the Act, 21 U.S.C. section 360bbb-3(b)(1), unless the authorization is terminated or revoked sooner. Performed at [facility], [address] [place], [place] [address]   CBG monitoring, ED     Status: Abnormal   Collection Time: 06/18/18  6:16 PM  Result Value Ref Range   Glucose-Capillary 384 (H) 70 - 99 mg/dL  CBG monitoring, ED     Status: Abnormal   Collection Time: 06/18/18  7:25 PM  Result Value Ref Range   Glucose-Capillary 290 (H) 70 - 99 mg/dL  CBG monitoring, ED     Status: Abnormal   Collection Time: 06/18/18  8:26 PM  Result Value Ref Range   Glucose-Capillary 250 (H) 70 - 99 mg/dL   Comment 1 Notify RN    Comment 2 Document in Chart   CBG monitoring, ED     Status: Abnormal   Collection Time: 06/18/18  9:32 PM  Result Value Ref Range   Glucose-Capillary 222 (H) 70 - 99 mg/dL   Comment 1 Document in Chart    Dg Chest 2 View  Result Date: 06/18/2018 CLINICAL DATA:  Hypertension.  Pain. EXAM: CHEST - 2 VIEW COMPARISON:  None. FINDINGS: Lungs  are clear. Heart size and pulmonary vascularity are normal. No adenopathy. There is postoperative change in the lower cervical region. No pneumothorax. IMPRESSION: No edema or consolidation. Electronically Signed   By: Bretta Bang III M.D.   On: 06/18/2018 19:25   US Abdomen Limited Ruq  Result Date: 06/18/2018 CLINICAL DATA:  Upper abdominal pain EXAM: ULTRASOUND ABDOMEN LIMITED RIGHT UPPER QUADRANT COMPARISON:  None. FINDINGS: Gallbladder: No gallstones or wall thickening  visualized. There is no pericholecystic fluid. No sonographic Murphy sign noted by sonographer. Common bile duct: Diameter: 8 mm proximally with tapering the 6 mm. No biliary duct mass or calculus. No evident intrahepatic biliary duct dilatation. Liver: No focal lesion identified. Within normal limits in parenchymal echogenicity. Portal vein is patent on color Doppler imaging with normal direction of blood flow towards the liver. IMPRESSION: Mildly prominent proximal common bile duct with mild tapering distally. No biliary duct mass or calculus evident by ultrasound. Study otherwise unremarkable. Electronically Signed   By: Bretta Bang III M.D.   On: 06/18/2018 19:24    Pending Labs Unresulted Labs (From admission, onward)    Start     Ordered   06/18/18 2152  Comprehensive metabolic panel  ONCE - STAT,   STAT     06/18/18 2151   Signed and Held  HIV antibody (Routine Testing)  Tomorrow morning,   R     Signed and Held   Signed and Held  Basic metabolic panel  STAT Now then every 4 hours ,   STAT     Signed and Held   Signed and Held  Beta-hydroxybutyric acid  Add-on,   R     Signed and Held   Signed and Held  Urine rapid drug screen (hosp performed)not at [employer] [facility]  Add-on,   R     Signed and Held   Signed and Held  Hemoglobin A1c  Add-on,   R     Signed and Held          Vitals/Pain Today's Vitals   06/18/18 2100 06/18/18 2114 06/18/18 2137 06/18/18 2145  BP:   (!) 137/96 115/70  Pulse: (!) 103  (!) 101 (!) 104  Resp: 12  (!) 21 15  Temp:      TempSrc:      SpO2: 99%  98% 98%  Weight:      Height:      PainSc:  7       Isolation Precautions No active isolations  Medications Medications  insulin regular, human (MYXREDLIN) 100 units/ 100 mL infusion (4.9 Units/hr Intravenous Rate/Dose Change 06/18/18 2136)  sodium chloride 0.9 % bolus 1,000 mL (0 mLs Intravenous Stopped 06/18/18 1819)    And  0.9 %  sodium chloride infusion ( Intravenous Stopped 06/18/18 2114)  dextrose 5  %-0.45 % sodium chloride infusion ( Intravenous New Bag/Given 06/18/18 2042)  morphine 4 MG/ML injection 4 mg (4 mg Intravenous Given 06/18/18 1733)  ondansetron (ZOFRAN) injection 4 mg (4 mg Intravenous Given 06/18/18 1733)  potassium chloride 10 mEq in 100 mL IVPB (0 mEq Intravenous Stopped 06/18/18 2114)  fentaNYL (SUBLIMAZE) injection 50 mcg (50 mcg Intravenous Given 06/18/18 2127)    Mobility walks with device Low fall risk   Focused Assessments GI/GU assessment   R Recommendations: See Admitting Provider Note  Report given to:   Additional Notes: Insulin drip at 4.9 units/hr.  Last CBG was 222

## 2018-06-19 ENCOUNTER — Observation Stay (HOSPITAL_COMMUNITY): Payer: Medicare PPO

## 2018-06-19 ENCOUNTER — Encounter (HOSPITAL_COMMUNITY): Payer: Self-pay | Admitting: Internal Medicine

## 2018-06-19 DIAGNOSIS — Z720 Tobacco use: Secondary | ICD-10-CM | POA: Diagnosis present

## 2018-06-19 DIAGNOSIS — R109 Unspecified abdominal pain: Secondary | ICD-10-CM | POA: Diagnosis not present

## 2018-06-19 DIAGNOSIS — E1110 Type 2 diabetes mellitus with ketoacidosis without coma: Secondary | ICD-10-CM | POA: Diagnosis not present

## 2018-06-19 DIAGNOSIS — E0810 Diabetes mellitus due to underlying condition with ketoacidosis without coma: Secondary | ICD-10-CM | POA: Diagnosis not present

## 2018-06-19 DIAGNOSIS — Z1159 Encounter for screening for other viral diseases: Secondary | ICD-10-CM | POA: Diagnosis not present

## 2018-06-19 DIAGNOSIS — E1310 Other specified diabetes mellitus with ketoacidosis without coma: Secondary | ICD-10-CM | POA: Diagnosis not present

## 2018-06-19 DIAGNOSIS — F329 Major depressive disorder, single episode, unspecified: Secondary | ICD-10-CM | POA: Diagnosis not present

## 2018-06-19 LAB — COMPREHENSIVE METABOLIC PANEL
ALT: 18 U/L (ref 0–44)
AST: 16 U/L (ref 15–41)
Albumin: 3.3 g/dL — ABNORMAL LOW (ref 3.5–5.0)
Alkaline Phosphatase: 141 U/L — ABNORMAL HIGH (ref 38–126)
Anion gap: 10 (ref 5–15)
BUN: 11 mg/dL (ref 6–20)
CO2: 23 mmol/L (ref 22–32)
Calcium: 8.7 mg/dL — ABNORMAL LOW (ref 8.9–10.3)
Chloride: 104 mmol/L (ref 98–111)
Creatinine, Ser: 0.75 mg/dL (ref 0.61–1.24)
GFR calc Af Amer: >60 mL/min (ref >60)
GFR calc non Af Amer: >60 mL/min (ref >60)
Glucose, Bld: 144 mg/dL — ABNORMAL HIGH (ref 70–99)
Potassium: 3.3 mmol/L — ABNORMAL LOW (ref 3.5–5.1)
Sodium: 137 mmol/L (ref 135–145)
Total Bilirubin: 0.6 mg/dL (ref 0.3–1.2)
Total Protein: 6.6 g/dL (ref 6.5–8.1)

## 2018-06-19 LAB — BASIC METABOLIC PANEL
Anion gap: 11 (ref 5–15)
Anion gap: 9 (ref 5–15)
BUN: 11 mg/dL (ref 6–20)
BUN: 14 mg/dL (ref 6–20)
CO2: 21 mmol/L — ABNORMAL LOW (ref 22–32)
CO2: 22 mmol/L (ref 22–32)
Calcium: 8.7 mg/dL — ABNORMAL LOW (ref 8.9–10.3)
Calcium: 8.7 mg/dL — ABNORMAL LOW (ref 8.9–10.3)
Chloride: 103 mmol/L (ref 98–111)
Chloride: 105 mmol/L (ref 98–111)
Creatinine, Ser: 0.67 mg/dL (ref 0.61–1.24)
Creatinine, Ser: 0.81 mg/dL (ref 0.61–1.24)
GFR calc Af Amer: >60 mL/min (ref >60)
GFR calc Af Amer: >60 mL/min (ref >60)
GFR calc non Af Amer: >60 mL/min (ref >60)
GFR calc non Af Amer: >60 mL/min (ref >60)
Glucose, Bld: 135 mg/dL — ABNORMAL HIGH (ref 70–99)
Glucose, Bld: 194 mg/dL — ABNORMAL HIGH (ref 70–99)
Potassium: 3.4 mmol/L — ABNORMAL LOW (ref 3.5–5.1)
Potassium: 3.5 mmol/L (ref 3.5–5.1)
Sodium: 135 mmol/L (ref 135–145)
Sodium: 136 mmol/L (ref 135–145)

## 2018-06-19 LAB — CBC
HCT: 36.5 % — ABNORMAL LOW (ref 39.0–52.0)
Hemoglobin: 12.8 g/dL — ABNORMAL LOW (ref 13.0–17.0)
MCH: 30.4 pg (ref 26.0–34.0)
MCHC: 35.1 g/dL (ref 30.0–36.0)
MCV: 86.7 fL (ref 80.0–100.0)
Platelets: 237 10*3/uL (ref 150–400)
RBC: 4.21 MIL/uL — ABNORMAL LOW (ref 4.22–5.81)
RDW: 12.6 % (ref 11.5–15.5)
WBC: 7.8 10*3/uL (ref 4.0–10.5)
nRBC: 0 % (ref 0.0–0.2)

## 2018-06-19 LAB — GLUCOSE, CAPILLARY
Glucose-Capillary: 102 mg/dL — ABNORMAL HIGH (ref 70–99)
Glucose-Capillary: 110 mg/dL — ABNORMAL HIGH (ref 70–99)
Glucose-Capillary: 110 mg/dL — ABNORMAL HIGH (ref 70–99)
Glucose-Capillary: 132 mg/dL — ABNORMAL HIGH (ref 70–99)
Glucose-Capillary: 144 mg/dL — ABNORMAL HIGH (ref 70–99)
Glucose-Capillary: 152 mg/dL — ABNORMAL HIGH (ref 70–99)
Glucose-Capillary: 156 mg/dL — ABNORMAL HIGH (ref 70–99)
Glucose-Capillary: 248 mg/dL — ABNORMAL HIGH (ref 70–99)

## 2018-06-19 LAB — RAPID URINE DRUG SCREEN, HOSP PERFORMED
Amphetamines: NOT DETECTED
Barbiturates: NOT DETECTED
Benzodiazepines: NOT DETECTED
Cocaine: NOT DETECTED
Opiates: POSITIVE — AB
Tetrahydrocannabinol: NOT DETECTED

## 2018-06-19 LAB — TSH: TSH: 0.879 u[IU]/mL (ref 0.350–4.500)

## 2018-06-19 LAB — HIV ANTIBODY (ROUTINE TESTING W REFLEX): HIV Screen 4th Generation wRfx: NONREACTIVE

## 2018-06-19 LAB — HEMOGLOBIN A1C
Hgb A1c MFr Bld: 11.4 % — ABNORMAL HIGH (ref 4.8–5.6)
Hgb A1c MFr Bld: 11.6 % — ABNORMAL HIGH (ref 4.8–5.6)
Mean Plasma Glucose: 280.48 mg/dL
Mean Plasma Glucose: 286.22 mg/dL

## 2018-06-19 LAB — BETA-HYDROXYBUTYRIC ACID: Beta-Hydroxybutyric Acid: 0.14 mmol/L (ref 0.05–0.27)

## 2018-06-19 LAB — MAGNESIUM: Magnesium: 2.1 mg/dL (ref 1.7–2.4)

## 2018-06-19 LAB — PHOSPHORUS: Phosphorus: 2.7 mg/dL (ref 2.5–4.6)

## 2018-06-19 MED ORDER — GABAPENTIN 800 MG PO TABS: 800.0000 mg | ORAL_TABLET | Freq: Three times a day (TID) | ORAL | 1 refills | Status: AC

## 2018-06-19 MED ORDER — POLYETHYLENE GLYCOL 3350 17 G PO PACK: 17.0000 g | PACK | Freq: Every day | ORAL | 0 refills | Status: AC | PRN

## 2018-06-19 MED ORDER — BISACODYL 10 MG RE SUPP: 10.0000 mg | Freq: Every day | RECTAL | Status: DC | PRN

## 2018-06-19 MED ORDER — ONDANSETRON HCL 4 MG/2ML IJ SOLN: 4.0000 mg | Freq: Four times a day (QID) | INTRAMUSCULAR | Status: DC | PRN

## 2018-06-19 MED ORDER — INSULIN GLARGINE 100 UNIT/ML ~~LOC~~ SOLN
15.0000 [IU] | Freq: Every day | SUBCUTANEOUS | Status: DC
  Administered 2018-06-19: 15 [IU] via SUBCUTANEOUS
  Filled 2018-06-19: qty 0.15

## 2018-06-19 MED ORDER — INSULIN ASPART 100 UNIT/ML ~~LOC~~ SOLN
0.0000 [IU] | Freq: Three times a day (TID) | SUBCUTANEOUS | Status: DC
  Administered 2018-06-19: 3 [IU] via SUBCUTANEOUS

## 2018-06-19 MED ORDER — GABAPENTIN 400 MG PO CAPS
800.0000 mg | ORAL_CAPSULE | Freq: Three times a day (TID) | ORAL | Status: DC
  Administered 2018-06-19: 800 mg via ORAL
  Filled 2018-06-19: qty 2

## 2018-06-19 MED ORDER — CLONIDINE HCL 0.1 MG PO TABS
0.0500 mg | ORAL_TABLET | Freq: Two times a day (BID) | ORAL | Status: DC
  Administered 2018-06-19 (×2): 0.05 mg via ORAL
  Filled 2018-06-19 (×2): qty 1

## 2018-06-19 MED ORDER — ACETAMINOPHEN 325 MG PO TABS
650.0000 mg | ORAL_TABLET | Freq: Four times a day (QID) | ORAL | Status: DC | PRN
  Administered 2018-06-19: 650 mg via ORAL
  Filled 2018-06-19: qty 2

## 2018-06-19 MED ORDER — PRAZOSIN HCL 5 MG PO CAPS: 5.0000 mg | ORAL_CAPSULE | Freq: Every day | ORAL | 3 refills | Status: AC

## 2018-06-19 MED ORDER — LISINOPRIL 5 MG PO TABS
5.0000 mg | ORAL_TABLET | Freq: Every day | ORAL | Status: DC
  Administered 2018-06-19: 5 mg via ORAL
  Filled 2018-06-19: qty 1

## 2018-06-19 MED ORDER — ATORVASTATIN CALCIUM 40 MG PO TABS
40.0000 mg | ORAL_TABLET | Freq: Every day | ORAL | Status: DC
  Administered 2018-06-19: 40 mg via ORAL
  Filled 2018-06-19: qty 1

## 2018-06-19 MED ORDER — CLONIDINE HCL 0.1 MG PO TABS: 0.0500 mg | ORAL_TABLET | Freq: Two times a day (BID) | ORAL | 11 refills | Status: AC

## 2018-06-19 MED ORDER — ONDANSETRON HCL 4 MG PO TABS: 4.0000 mg | ORAL_TABLET | Freq: Four times a day (QID) | ORAL | Status: DC | PRN

## 2018-06-19 MED ORDER — SERTRALINE HCL 50 MG PO TABS
50.0000 mg | ORAL_TABLET | Freq: Every day | ORAL | Status: DC
  Administered 2018-06-19: 50 mg via ORAL
  Filled 2018-06-19: qty 1

## 2018-06-19 MED ORDER — INSULIN ASPART 100 UNIT/ML ~~LOC~~ SOLN: 0.0000 [IU] | Freq: Every day | SUBCUTANEOUS | Status: DC

## 2018-06-19 MED ORDER — METFORMIN HCL 1000 MG PO TABS: 1000.0000 mg | ORAL_TABLET | Freq: Two times a day (BID) | ORAL | 5 refills | Status: AC

## 2018-06-19 MED ORDER — INSULIN ASPART 100 UNIT/ML FLEXPEN: PEN_INJECTOR | SUBCUTANEOUS | 11 refills | Status: DC

## 2018-06-19 MED ORDER — GLIPIZIDE 5 MG PO TABS: 5.0000 mg | ORAL_TABLET | Freq: Every day | ORAL | 2 refills | Status: DC

## 2018-06-19 MED ORDER — INSULIN PEN NEEDLE 32G X 4 MM MISC: 15.0000 [IU] | Freq: Every day | 3 refills | Status: AC

## 2018-06-19 MED ORDER — TRAZODONE HCL 100 MG PO TABS
100.0000 mg | ORAL_TABLET | Freq: Every day | ORAL | Status: DC
  Administered 2018-06-19: 100 mg via ORAL
  Filled 2018-06-19: qty 1

## 2018-06-19 MED ORDER — DOCUSATE SODIUM 100 MG PO CAPS
100.0000 mg | ORAL_CAPSULE | Freq: Two times a day (BID) | ORAL | Status: DC
  Administered 2018-06-19 (×2): 100 mg via ORAL
  Filled 2018-06-19 (×2): qty 1

## 2018-06-19 MED ORDER — ATORVASTATIN CALCIUM 40 MG PO TABS: 40.0000 mg | ORAL_TABLET | Freq: Every day | ORAL | 2 refills | Status: AC

## 2018-06-19 MED ORDER — OMEPRAZOLE 40 MG PO CPDR: 40.0000 mg | DELAYED_RELEASE_CAPSULE | Freq: Every day | ORAL | 3 refills | Status: AC

## 2018-06-19 MED ORDER — PANTOPRAZOLE SODIUM 40 MG PO TBEC
40.0000 mg | DELAYED_RELEASE_TABLET | Freq: Every day | ORAL | Status: DC
  Administered 2018-06-19: 40 mg via ORAL
  Filled 2018-06-19: qty 1

## 2018-06-19 MED ORDER — SERTRALINE HCL 100 MG PO TABS
100.0000 mg | ORAL_TABLET | Freq: Every day | ORAL | Status: DC
  Administered 2018-06-19: 100 mg via ORAL
  Filled 2018-06-19: qty 1

## 2018-06-19 MED ORDER — INSULIN GLARGINE 100 UNITS/ML SOLOSTAR PEN: 15.0000 [IU] | PEN_INJECTOR | Freq: Every day | SUBCUTANEOUS | 11 refills | Status: AC

## 2018-06-19 MED ORDER — CLOZAPINE 100 MG PO TABS
400.0000 mg | ORAL_TABLET | Freq: Every day | ORAL | Status: DC
  Administered 2018-06-19: 400 mg via ORAL
  Filled 2018-06-19 (×2): qty 4

## 2018-06-19 MED ORDER — DOCUSATE SODIUM 100 MG PO CAPS: 100.0000 mg | ORAL_CAPSULE | Freq: Two times a day (BID) | ORAL | 3 refills | Status: AC

## 2018-06-19 MED ORDER — ACETAMINOPHEN 650 MG RE SUPP: 650.0000 mg | Freq: Four times a day (QID) | RECTAL | Status: DC | PRN

## 2018-06-19 MED ORDER — NICOTINE 21 MG/24HR TD PT24
21.0000 mg | MEDICATED_PATCH | Freq: Every day | TRANSDERMAL | Status: DC
  Administered 2018-06-19: 21 mg via TRANSDERMAL
  Filled 2018-06-19: qty 1

## 2018-06-19 MED ORDER — LISINOPRIL 5 MG PO TABS: 5.0000 mg | ORAL_TABLET | Freq: Every day | ORAL | 2 refills | Status: DC

## 2018-06-19 NOTE — Progress Notes (Signed)
Inpatient Diabetes Program Recommendations  AACE/ADA: New Consensus Statement on Inpatient Glycemic Control (2015)  Target Ranges:  Prepandial:   less than 140 mg/dL      Peak postprandial:   less than 180 mg/dL (1-2 hours)      Critically ill patients:  140 - 180 mg/dL    Review of Glycemic Control  Diabetes history: DM 2 Outpatient Diabetes medications: Lantus 15 units, Glipizide 5 mg Daily, Metformin 1000 mg BID  Spoke with patient over the phone. Discussed A1c level and glucose and A1c goals. Patient reports wife would know more about their insurance being switched from [place] to [facility]. Received permission from patient to speak with wife.  Called wife. She reports the Lantus was switched to basaglar before they came down here, however was more expensive, they needed a new [place] from [place] to change back to Lantus, could not get it. No PCP offices in the area accepting new patients. Patient ran out of the meds. Wife said in January this year patient's A1c was 11.6% as well. Wife also mentioned patient was on a Novolog sliding scale and seemed to be more controlled on that. Patient's wife also mentioned insurance also is wanting them to do mail order prescriptions.  Wife reports patient will need prescriptions send to [facility] on [address] in addition to a mail order called into [facility] mail order using [phone] as mail order takes a few days to process initially and mail out. DM Coordinator can assist MD if needed.  Patient needs the following meds at d/c:  -  Lantus solostar pen order #  [phone]  -  Novolog Flex pen order #  [phone] (0-9 units tid with meals)  -  Insulin pen needles order #  [id number]  -  Glipizide (Glucotrol) 5 mg Daily  -  Metformin (Glucaphage) 1000 mg BID   Thanks,  Christena Deem RN, MSN, BC-ADM Inpatient Diabetes Coordinator Team Pager [phone] (8a-5p)

## 2018-06-19 NOTE — Plan of Care (Signed)

## 2018-06-19 NOTE — Discharge Summary (Signed)
Physician Discharge Summary   Patient ID: Bryan Carlson MRN: [id number] DOB/AGE: Mar 09, 1965 53 y.o.  Admit date: 06/18/2018 Discharge date: 06/19/2018  Primary Care Physician: Patient recommended to [facility]-up with [facility] until able to find PCP   Recommendations for Outpatient Follow-up:  1. Follow up with PCP in 1-2 weeks 2. Follow hemoglobin A1c in 1 month  Home Health:  Equipment/Devices:   Discharge Condition: stable  CODE STATUS: FULL  Diet recommendation: Carb modified diet   Discharge Diagnoses:    . DKA (diabetic ketoacidoses) (HCC) Type 2 diabetes mellitus, uncontrolled with hyperglycemia . Abdominal pain . Essential hypertension . Dementia ([place]) . Tobacco abuse   Consults: None    Allergies:   Allergies  Allergen Reactions  . Contrast Media [Iodinated Diagnostic Agents] Hives     DISCHARGE MEDICATIONS: Allergies as of 06/19/2018      Reactions   Contrast Media [iodinated Diagnostic Agents] Hives      Medication List    TAKE these medications   atorvastatin 40 MG tablet Commonly known as:  LIPITOR Take 1 tablet (40 mg total) by mouth daily.   cloNIDine 0.1 MG tablet Commonly known as:  CATAPRES Take 0.5 tablets (0.05 mg total) by mouth 2 (two) times daily.   cloZAPine 100 MG tablet Commonly known as:  CLOZARIL Take 400 mg by mouth at bedtime.   diphenhydrAMINE HCl 50 MG/30ML Liqd Take 50 mg by mouth at bedtime.   docusate sodium 100 MG capsule Commonly known as:  COLACE Take 1 capsule (100 mg total) by mouth 2 (two) times daily. For constipation   FISH OIL PO Take 1 capsule by mouth daily.   fluticasone 50 MCG/ACT nasal spray Commonly known as:  FLONASE Place 1 spray into both nostrils daily.   gabapentin 800 MG tablet Commonly known as:  NEURONTIN Take 1 tablet (800 mg total) by mouth 3 (three) times daily.   glipiZIDE 5 MG tablet Commonly known as:  GLUCOTROL Take 1 tablet (5 mg total) by mouth daily before  breakfast.   insulin aspart 100 UNIT/ML FlexPen Commonly known as:  NovoLOG FlexPen Sliding scale CBG 70 - 120: 0 units CBG 121 - 150: 1 unit,  CBG 151 - 200: 2 units,  CBG 201 - 250: 3 units,  CBG 251 - 300: 5 units,  CBG 301 - 350: 7 units,  CBG 351 - 400: 9 units   CBG > 400: 9 units and notify your MD   insulin glargine 100 unit/mL Sopn Commonly known as:  LANTUS Inject 0.15 mLs (15 Units total) into the skin daily.   Insulin Pen Needle 32G X 4 MM Misc Inject 15 Units into the skin daily. And novolog sliding scale   lisinopril 5 MG tablet Commonly known as:  ZESTRIL Take 1 tablet (5 mg total) by mouth daily.   Melatonin 10 MG Tabs Take 20 mg by mouth at bedtime.   metFORMIN 1000 MG tablet Commonly known as:  GLUCOPHAGE Take 1 tablet (1,000 mg total) by mouth 2 (two) times daily with a meal.   omeprazole 40 MG capsule Commonly known as:  PRILOSEC Take 1 capsule (40 mg total) by mouth daily.   polyethylene glycol 17 g packet Commonly known as:  MiraLax Take 17 g by mouth daily as needed for mild constipation or moderate constipation.   prazosin 5 MG capsule Commonly known as:  MINIPRESS Take 1 capsule (5 mg total) by mouth at bedtime.   sertraline 100 MG tablet Commonly known as:  ZOLOFT Take 100 mg by mouth daily.   sertraline 50 MG tablet Commonly known as:  ZOLOFT Take 50 mg by mouth daily.   traZODone 100 MG tablet Commonly known as:  DESYREL Take 100 mg by mouth at bedtime.        Brief H and P: For complete details please refer to admission H and P, but in brief patient is a 53 year old male with diabetes, depression, chronic pain syndrome, PTSD, schizophrenia had presented to ED with hyperglycemia and abdominal pain.  Patient and his wife had moved to [place] in March 2020 and had not been able to see anybody for his medical care due to pandemic.  Patient reported that his pain had been progressively getting worse.  Patient reported intermittent  nausea, constipation.  No fever chills or shortness of breath. Patient had ran out of his diabetic medications for the last 2 weeks. COVID test negative.  Hospital Course:     DKA (diabetic ketoacidoses) ([place]) in the setting of diabetes mellitus type 2, uncontrolled -Ran out of all his diabetic medications for the last 2 weeks -Anion gap 17, bicarb 19, CBG 488 at the time of admission. -Patient was placed on IV insulin drip and aggressive IV fluid hydration -He was transitioned to subcutaneous insulin, Lantus, sliding scale insulin -All his prescriptions including insulin Lantus FlexPen's, NovoLog FlexPen per sliding scale, metformin, glipizide, insulin pen needles were done with multiple refills until he is able to establish care.     Abdominal pain, with constipation -Unclear etiology, CT abdomen pelvis was negative for any acute intra-abdominal pathology, no cholecystitis -Patient was given prescription for Colace and MiraLAX -Needs outpatient follow-up with PCP and GI referral if continues to worsen.  AST, ALT normal, lipase 34, mild elevation of alk phos.  Small left lung nodule -Updated patient in detail, follow-up CT chest in 6 to 12 months    Essential hypertension -BP currently stable, refilled all his antihypertensives  Hyperlipidemia -Refilled statin    Tobacco abuse Counseled on tobacco cessation  Day of Discharge S: No complaints this morning, feels better, CBGs better, transitioned to subcu insulin  BP 119/81 (BP Location: Left Arm)   Pulse (!) 106   Temp 97.7 F (36.5 C) (Oral)   Resp 14   Ht _0  (1.753 m)   Wt 83.8 kg   SpO2 99%   BMI 27.28 kg/m   Physical Exam: General: Alert and awake oriented x3 not in any acute distress. HEENT: anicteric sclera, pupils reactive to light and accommodation CVS: S1-S2 clear no murmur rubs or gallops Chest: clear to auscultation bilaterally, no wheezing rales or rhonchi Abdomen: soft nontender, nondistended, normal  bowel sounds Extremities: no cyanosis, clubbing or edema noted bilaterally Neuro: Cranial nerves II-XII intact, no focal neurological deficits   The results of significant diagnostics from this hospitalization (including imaging, microbiology, ancillary and laboratory) are listed below for reference.      Procedures/Studies:  Ct Abdomen Pelvis Wo Contrast  Result Date: 06/19/2018 CLINICAL DATA:  Abdominal pain with unintended weight loss EXAM: CT ABDOMEN AND PELVIS WITHOUT CONTRAST TECHNIQUE: Multidetector CT imaging of the abdomen and pelvis was performed following the standard protocol without oral or IV contrast. COMPARISON:  Ultrasound right upper quadrant Jun 18, 2018. FINDINGS: Lower chest: There is bibasilar atelectatic change. On axial slice 13 series 4, there is a nodular opacity in the lateral segment of the left lower lobe measuring 8 x 7 mm. No consolidation in the lung bases evident. Hepatobiliary:  No focal liver lesions are demonstrable on this noncontrast enhanced study. The gallbladder wall is not appreciably thickened. There is no biliary duct dilatation evident by CT. No biliary duct mass or calculus demonstrable by CT. Pancreas: There is no appreciable pancreatic mass or inflammatory focus. Spleen: No splenic lesions are evident. Adrenals/Urinary Tract: Adrenals bilaterally appear unremarkable. There is a 7 mm cyst in lower pole of the right kidney. There is no appreciable hydronephrosis on either side. There is no renal or ureteral calculus on either side. Urinary bladder is midline with wall thickness within normal limits. Stomach/Bowel: There is no appreciable bowel wall or mesenteric thickening. There is no evident bowel obstruction. Terminal ileum appears unremarkable. There is no demonstrable free air or portal venous air. Vascular/Lymphatic: No abdominal aortic aneurysm evident. No vascular calcifications appreciable on this noncontrast enhanced study. There is no adenopathy in  the abdomen or pelvis. Reproductive: There are scattered prostate calculi. Prostate and seminal vesicles are normal in size and contour. No pelvic mass evident. Other: Appendix appears normal. There is no abscess or ascites in the abdomen or pelvis. Musculoskeletal: Postoperative changes are noted from L4-S1 with support hardware intact. There are no blastic or lytic bone lesions. There is no intramuscular or abdominal wall lesion evident. IMPRESSION: 1. 8 x 7 mm nodular opacity in the left lower lobe. Non-contrast chest CT at 6-12 months is recommended. If the nodule is stable at time of repeat CT, then future CT at 18-24 months (from today's scan) is considered optional for low-risk patients, but is recommended for high-risk patients. This recommendation follows the consensus statement: Guidelines for Management of Incidental Pulmonary Nodules Detected on CT Images: From the Fleischner Society 2017; Radiology 2017; 284:228-243. There is bibasilar atelectasis. 2. No evident bowel obstruction or bowel wall thickening. No abscess in the abdomen or pelvis. Appendix appears normal. 3. No evident renal or ureteral calculus. No hydronephrosis. Urinary bladder wall thickness normal. Scattered prostatic calculi noted. 4.  No adenopathy evident in the abdomen or pelvis. 5. Postoperative changes in the lower lumbar and upper sacral regions present. Electronically Signed   By: Lowella Grip III M.D.   On: 06/19/2018 09:03   Dg Chest 2 View  Result Date: 06/18/2018 CLINICAL DATA:  Hypertension.  Pain. EXAM: CHEST - 2 VIEW COMPARISON:  None. FINDINGS: Lungs are clear. Heart size and pulmonary vascularity are normal. No adenopathy. There is postoperative change in the lower cervical region. No pneumothorax. IMPRESSION: No edema or consolidation. Electronically Signed   By: Lowella Grip III M.D.   On: 06/18/2018 19:25   US Abdomen Limited Ruq  Result Date: 06/18/2018 CLINICAL DATA:  Upper abdominal pain EXAM:  ULTRASOUND ABDOMEN LIMITED RIGHT UPPER QUADRANT COMPARISON:  None. FINDINGS: Gallbladder: No gallstones or wall thickening visualized. There is no pericholecystic fluid. No sonographic Murphy sign noted by sonographer. Common bile duct: Diameter: 8 mm proximally with tapering the 6 mm. No biliary duct mass or calculus. No evident intrahepatic biliary duct dilatation. Liver: No focal lesion identified. Within normal limits in parenchymal echogenicity. Portal vein is patent on color Doppler imaging with normal direction of blood flow towards the liver. IMPRESSION: Mildly prominent proximal common bile duct with mild tapering distally. No biliary duct mass or calculus evident by ultrasound. Study otherwise unremarkable. Electronically Signed   By: Lowella Grip III M.D.   On: 06/18/2018 19:24      LAB RESULTS: Basic Metabolic Panel: Recent Labs  Lab 06/19/18 0732 06/19/18 1018  NA 137 136  K 3.3* 3.5  CL 104 103  CO2 23 22  GLUCOSE 144* 194*  BUN 11 11  CREATININE 0.75 0.67  CALCIUM 8.7* 8.7*  MG 2.1  --   PHOS 2.7  --    Liver Function Tests: Recent Labs  Lab 06/18/18 2233 06/19/18 0732  AST 16 16  ALT 20 18  ALKPHOS 158* 141*  BILITOT 0.5 0.6  PROT 6.8 6.6  ALBUMIN 3.4* 3.3*   Recent Labs  Lab 06/18/18 1449  LIPASE 34   No results for input(s): AMMONIA in the last 168 hours. CBC: Recent Labs  Lab 06/18/18 1449 06/18/18 1743 06/19/18 0732  WBC 11.3*  --  7.8  HGB 15.0 13.9 12.8*  HCT 41.1 41.0 36.5*  MCV 86.7  --  86.7  PLT 314  --  237   Cardiac Enzymes: No results for input(s): CKTOTAL, CKMB, CKMBINDEX, TROPONINI in the last 168 hours. BNP: Invalid input(s): POCBNP CBG: Recent Labs  Lab 06/19/18 0815 06/19/18 1133  GLUCAP 110* 248*      Disposition and Follow-up: Discharge Instructions    Diet Carb Modified   Complete by:  As directed    Discharge instructions   Complete by:  As directed    Please call [facility] on  Monday (phone number in the discharge instructions) if they can follow-up and establish care.  If not, you can also try [facility] for follow-up.   Increase activity slowly   Complete by:  As directed        DISPOSITION: Home   DISCHARGE FOLLOW-UP Follow-up Information    [facility]. Schedule an appointment as soon as possible for a visit in 2 week(s).   Why:  please call on Monday if you can establish care and follow-up, tele health visit  Contact information: [address] [place] [address] [phone]           Time coordinating discharge:  98mns   Signed:   REstill CottaM.D. [facility] 06/19/2018, 12:22 PM

## 2018-07-06 DIAGNOSIS — E1169 Type 2 diabetes mellitus with other specified complication: Secondary | ICD-10-CM | POA: Insufficient documentation

## 2018-07-06 DIAGNOSIS — Z794 Long term (current) use of insulin: Secondary | ICD-10-CM | POA: Insufficient documentation

## 2018-07-06 DIAGNOSIS — R351 Nocturia: Secondary | ICD-10-CM | POA: Insufficient documentation

## 2018-10-07 DIAGNOSIS — F333 Major depressive disorder, recurrent, severe with psychotic symptoms: Secondary | ICD-10-CM | POA: Insufficient documentation

## 2018-10-07 DIAGNOSIS — M545 Low back pain, unspecified: Secondary | ICD-10-CM | POA: Insufficient documentation

## 2018-10-07 DIAGNOSIS — E1142 Type 2 diabetes mellitus with diabetic polyneuropathy: Secondary | ICD-10-CM | POA: Insufficient documentation

## 2020-07-20 IMAGING — US ULTRASOUND ABDOMEN LIMITED
1 series · 14 of 25 positions shown · non-contrast
Comparison: None.

CLINICAL DATA: Upper abdominal pain

EXAM:
ULTRASOUND ABDOMEN LIMITED RIGHT UPPER QUADRANT

[Series 1: ultrasound abdomen limited · 14 of 58 slices shown]
[im 1/58]
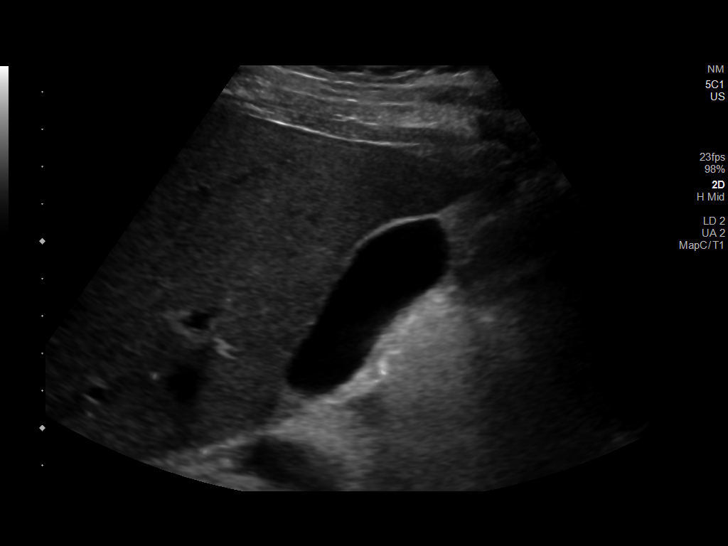
[im 5/58]
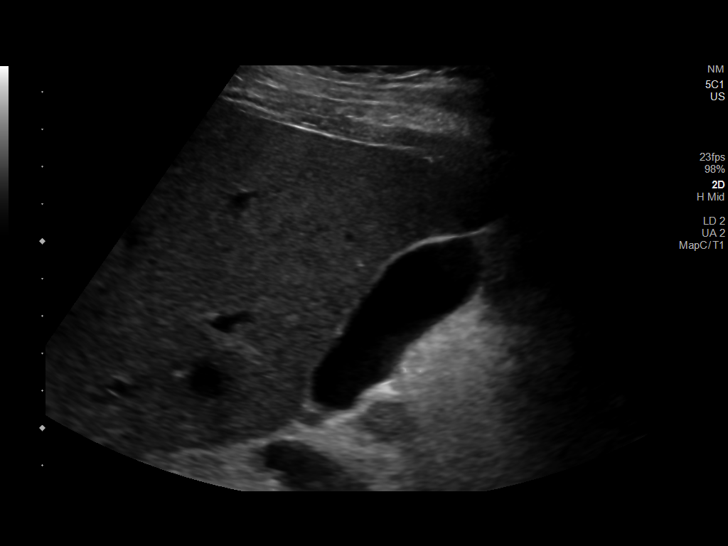
[im 10/58]
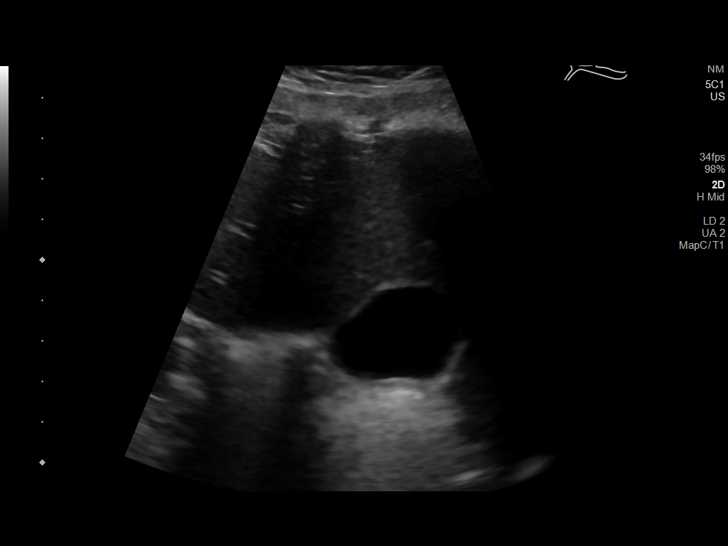
[im 15/58]
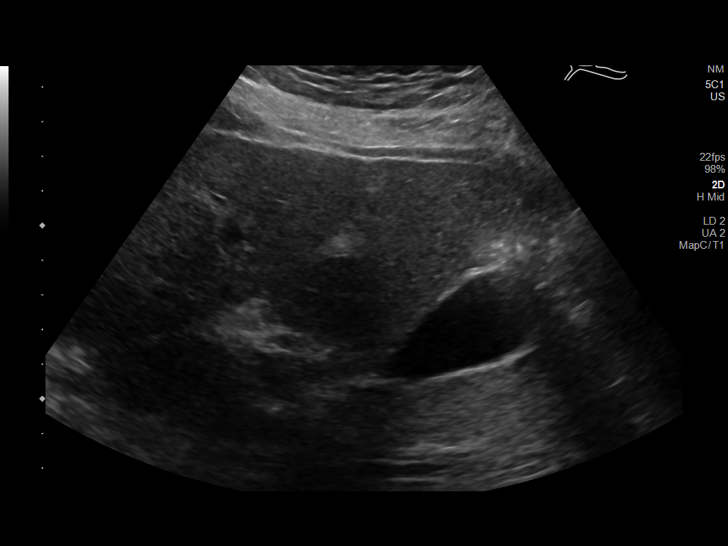
[im 20/58]
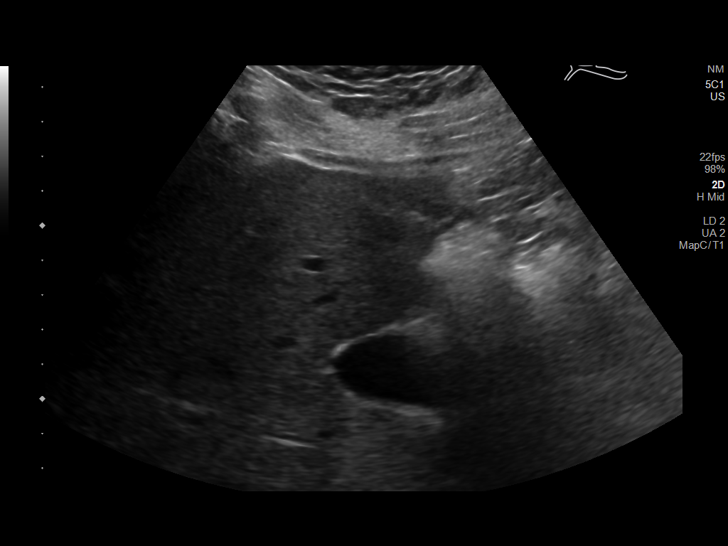
[im 22/58]
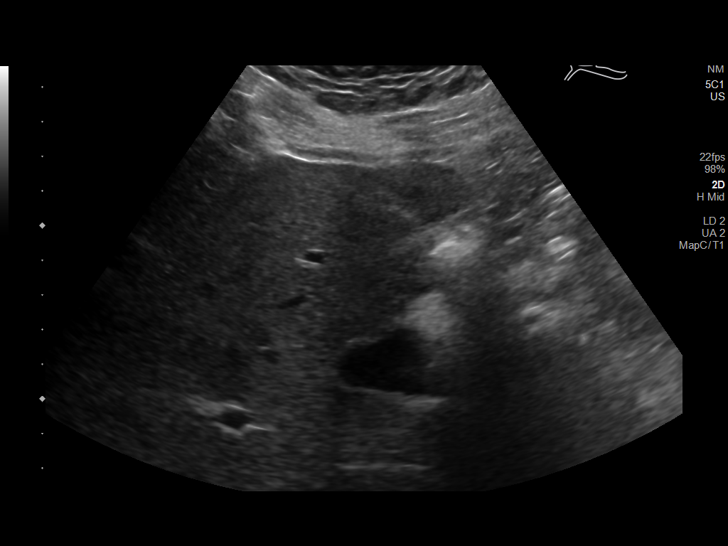
[im 27/58]
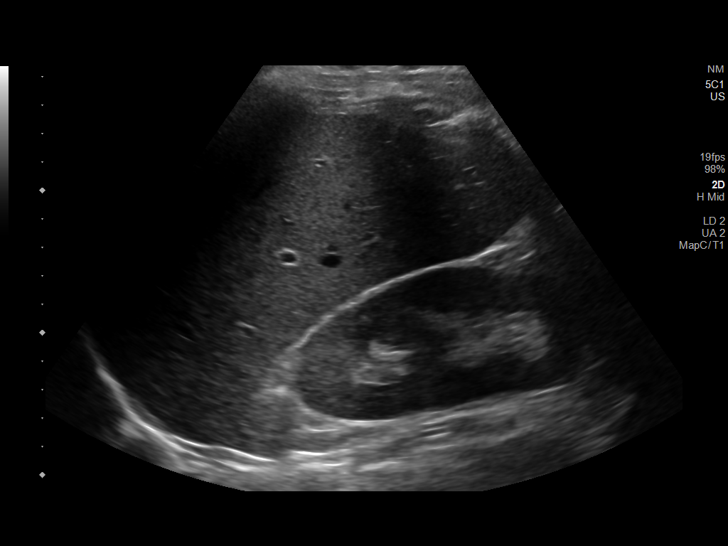
[im 31/58]
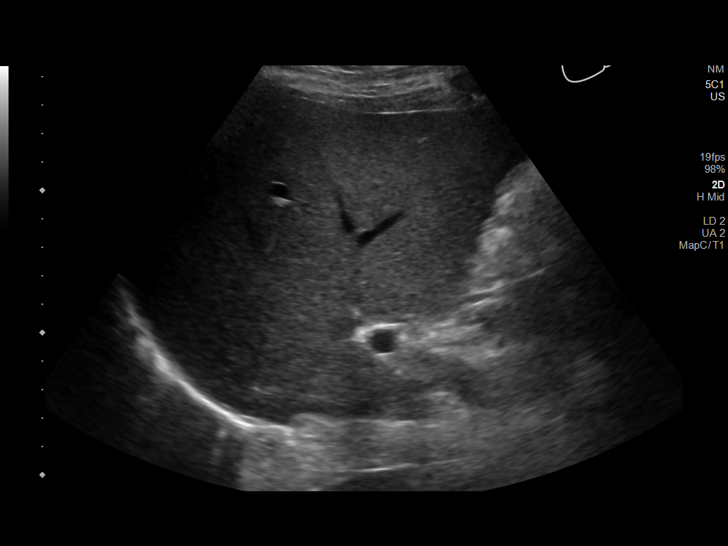
[im 36/58]
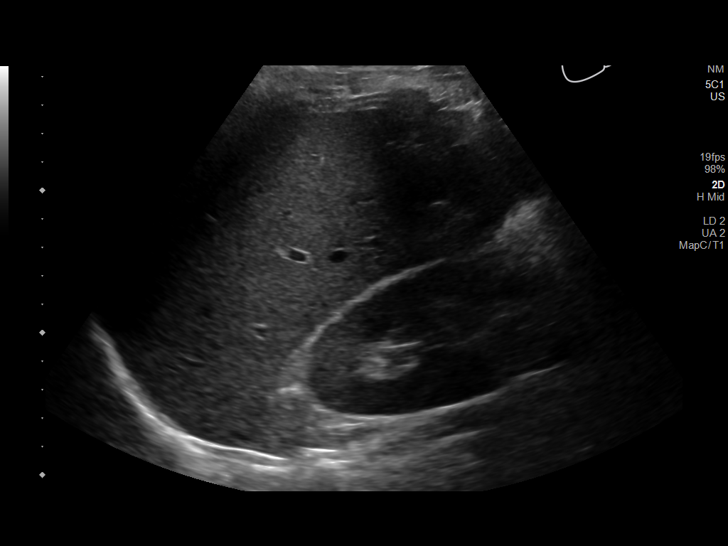
[im 39/58]
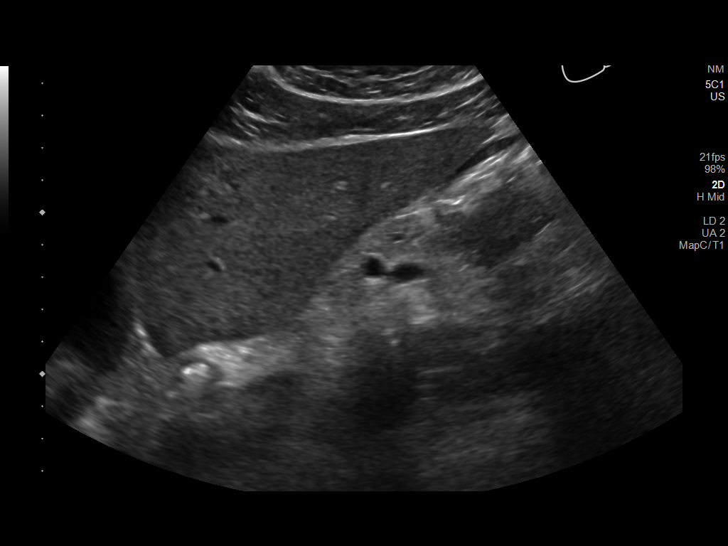
[im 43/58]
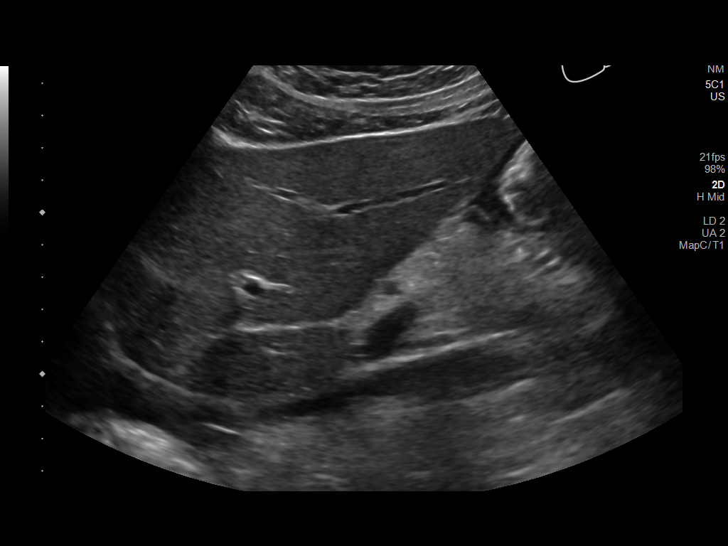
[im 48/58]
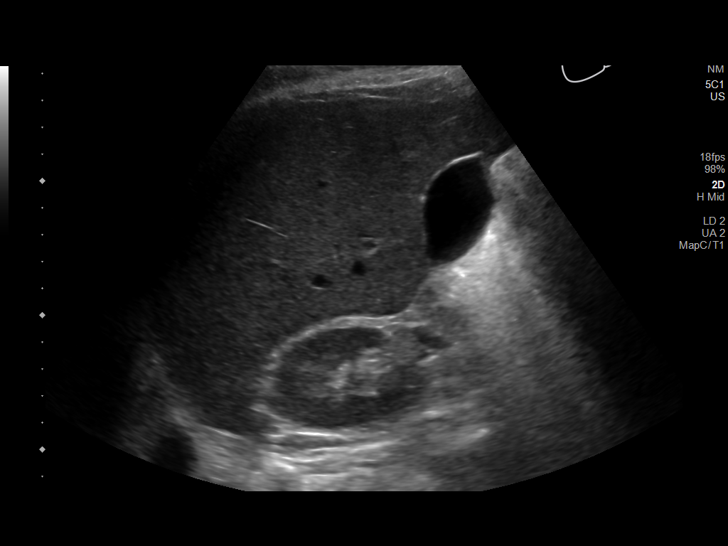
[im 53/58]
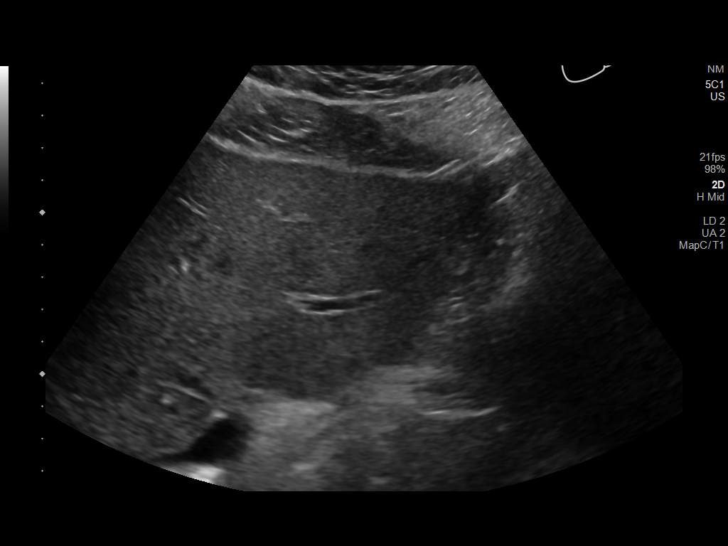
[im 58/58]
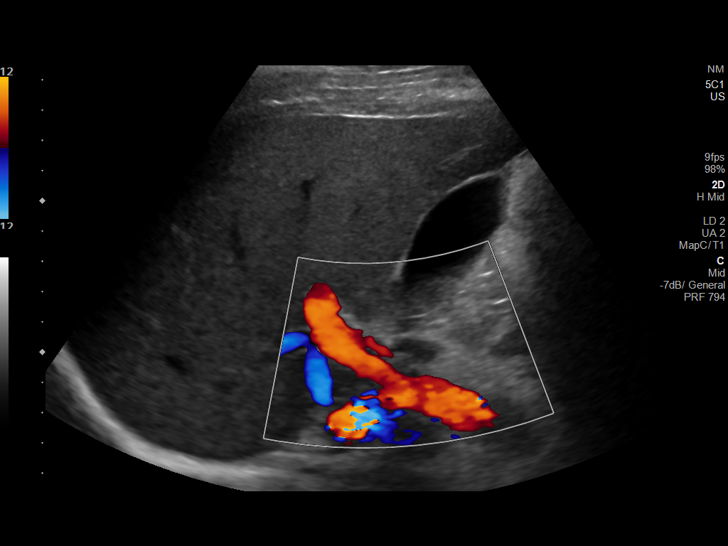

[14 of 25 positions shown; findings below may reference images not displayed]

FINDINGS: Gallbladder:

No gallstones or wall thickening visualized. There is no
pericholecystic fluid. No sonographic Murphy sign noted by
sonographer.

Common bile duct:

Diameter: 8 mm proximally with tapering the 6 mm. No biliary duct
mass or calculus. No evident intrahepatic biliary duct dilatation.

Liver:

No focal lesion identified. Within normal limits in parenchymal
echogenicity. Portal vein is patent on color Doppler imaging with
normal direction of blood flow towards the liver.
IMPRESSION: Mildly prominent proximal common bile duct with mild tapering
distally. No biliary duct mass or calculus evident by ultrasound.

Study otherwise unremarkable.

## 2020-07-20 IMAGING — DX CHEST - 2 VIEW
2 series · 2 of 2 positions shown · non-contrast
Comparison: None.

CLINICAL DATA: Hypertension.  Pain.

EXAM:
CHEST - 2 VIEW

[chest pa]
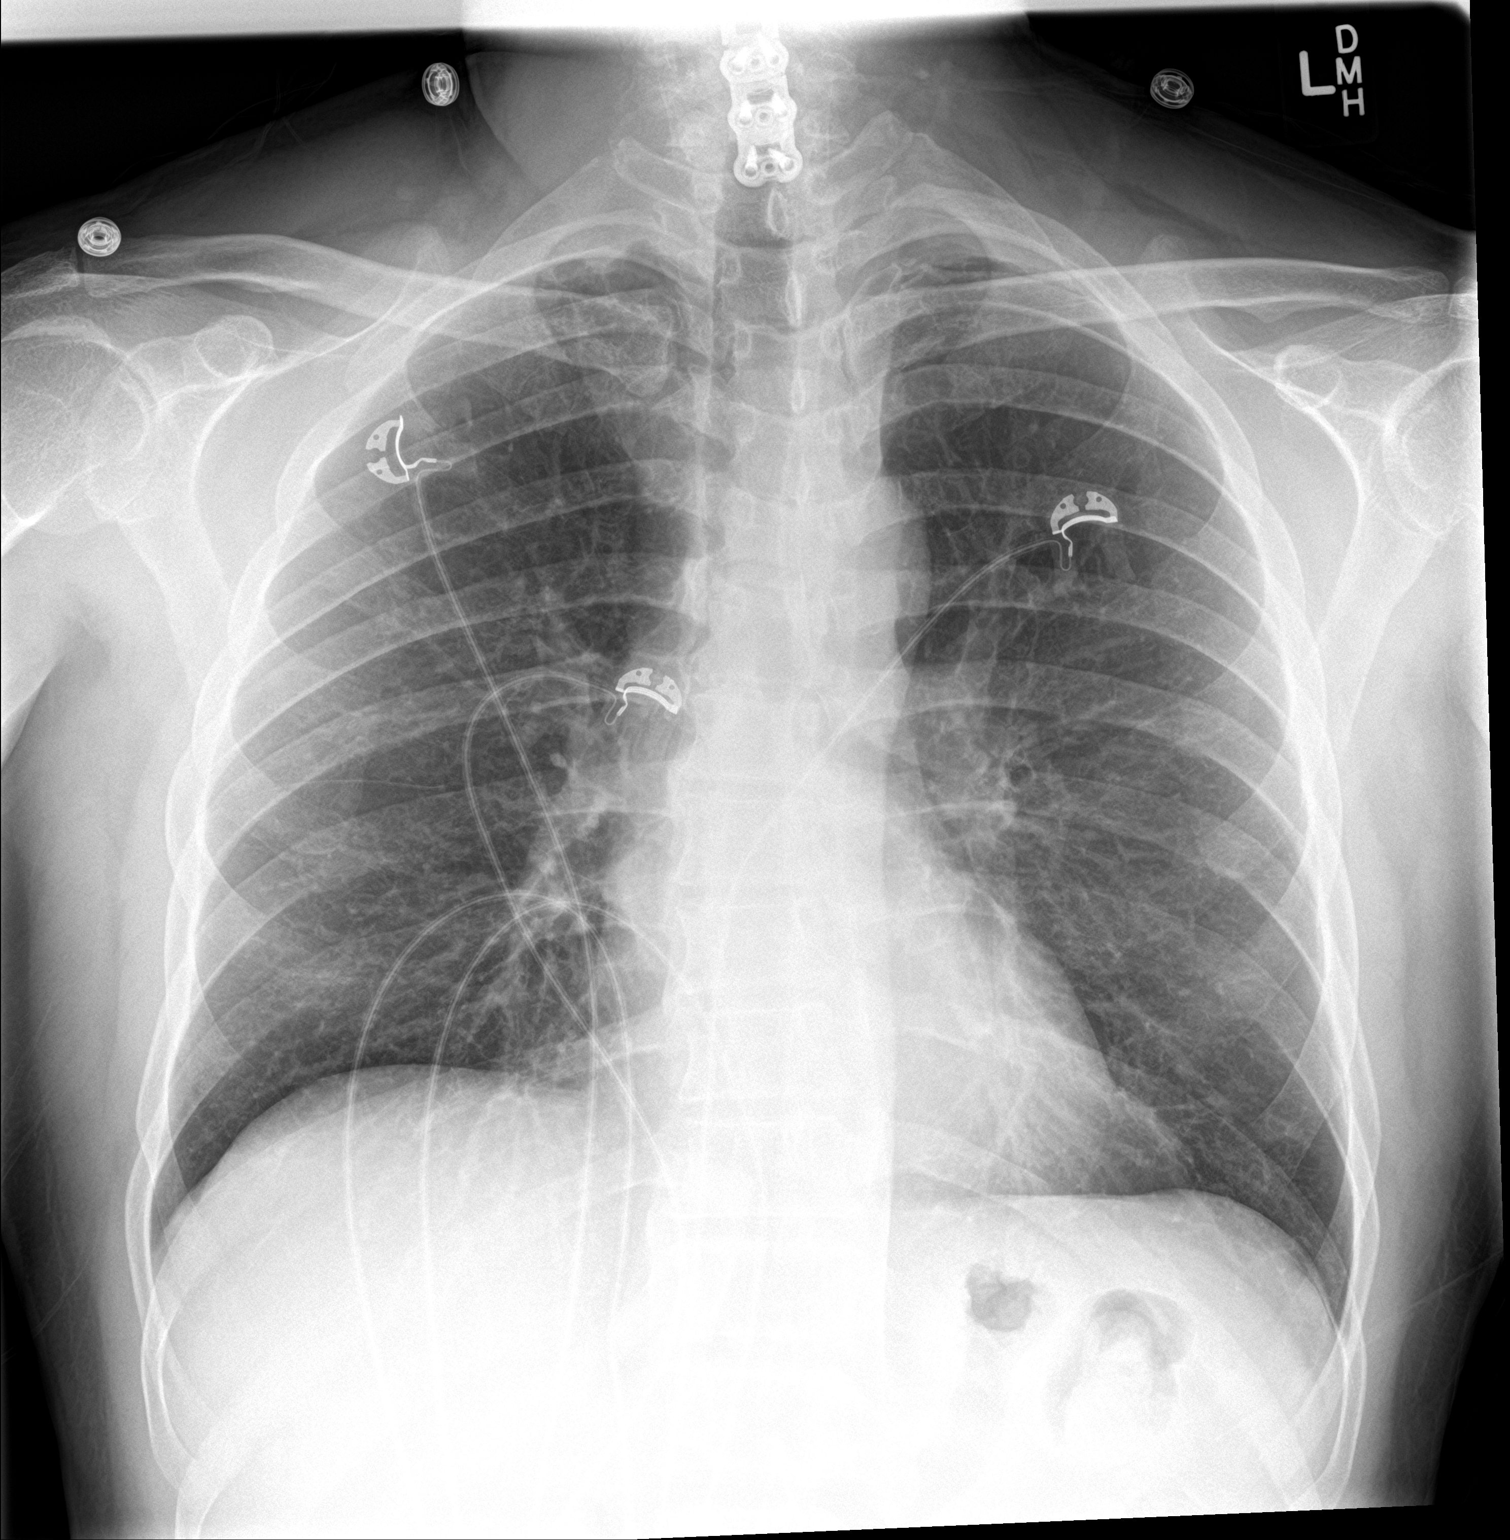

[chest lat]
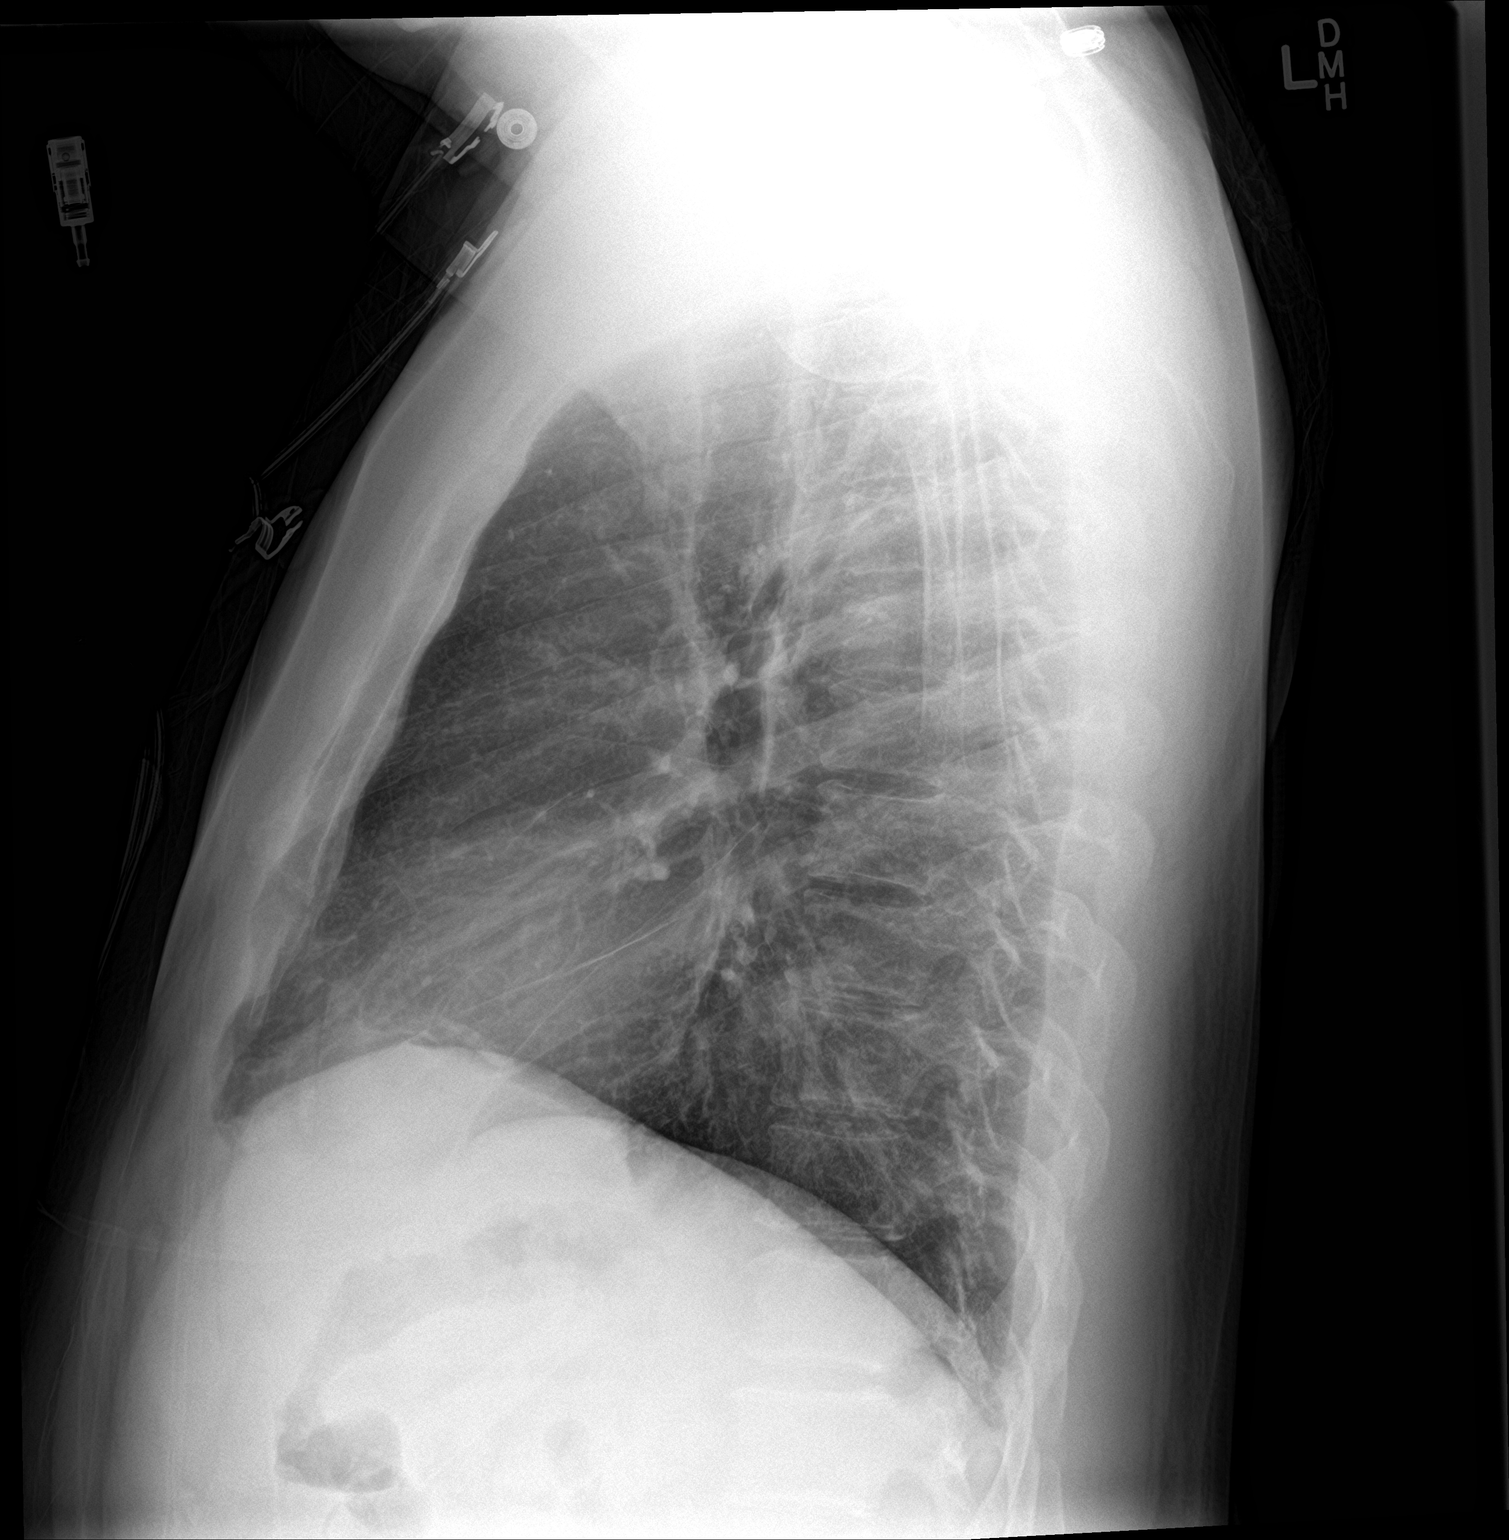

[2 of 2 positions shown; findings below may reference images not displayed]

FINDINGS: Lungs are clear. Heart size and pulmonary vascularity are normal. No
adenopathy. There is postoperative change in the lower cervical
region. No pneumothorax.
IMPRESSION: No edema or consolidation.

## 2020-07-21 IMAGING — CT CT ABDOMEN AND PELVIS WITHOUT CONTRAST
2 of 4 series · 15 of 46 positions shown, 17 images · non-contrast
Comparison: Ultrasound right upper quadrant June 18, 2018.

CLINICAL DATA: Abdominal pain with unintended weight loss

EXAM:
CT ABDOMEN AND PELVIS WITHOUT CONTRAST
TECHNIQUE: Multidetector CT imaging of the abdomen and pelvis was performed
following the standard protocol without oral or IV contrast.

[Series 3: abd/ pelvis 5.0 i30f 2 · axial · 0.91mm/px · z∈[-571,-106]mm · 12 of 103 slices shown, 14 images]
[im 5/103  soft-tissue]
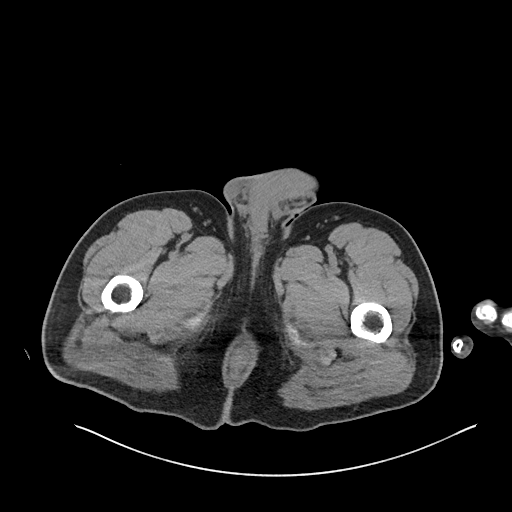
[im 5/103  bone]
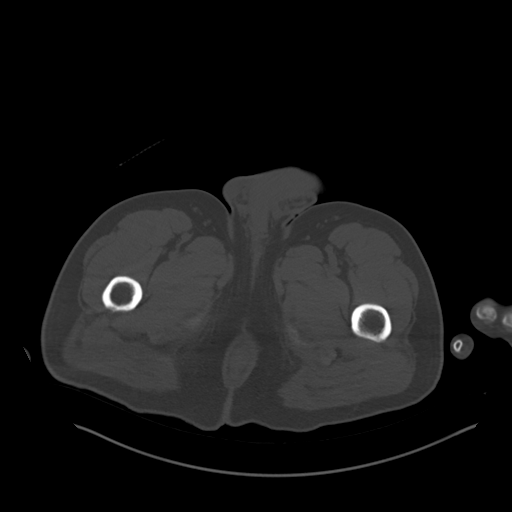
[im 13/103  soft-tissue]
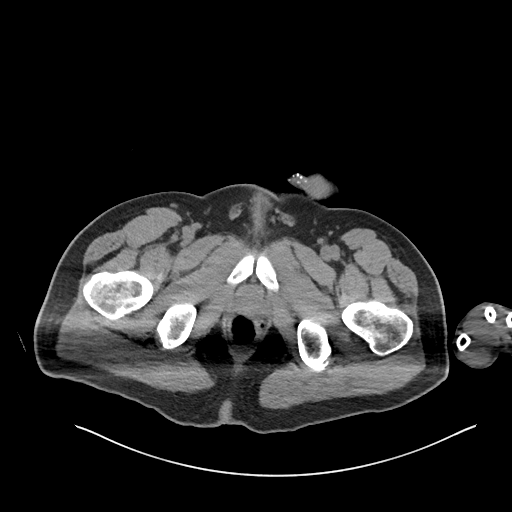
[im 22/103  soft-tissue]
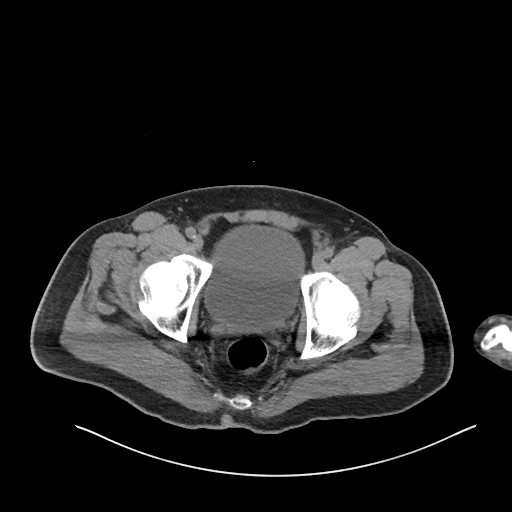
[im 30/103  soft-tissue]
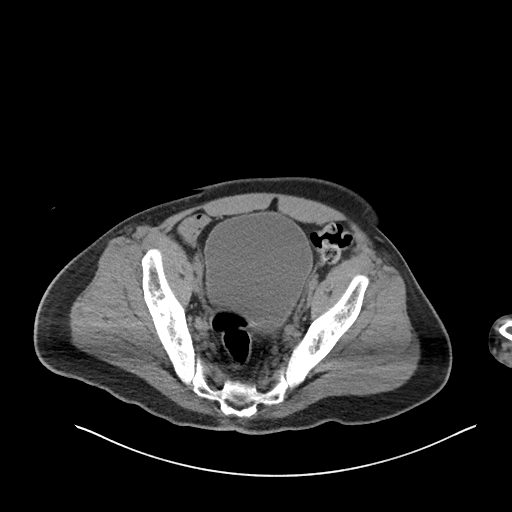
[im 39/103  soft-tissue]
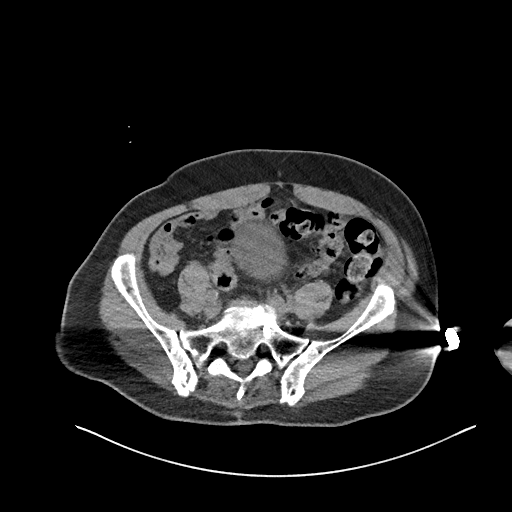
[im 47/103  soft-tissue]
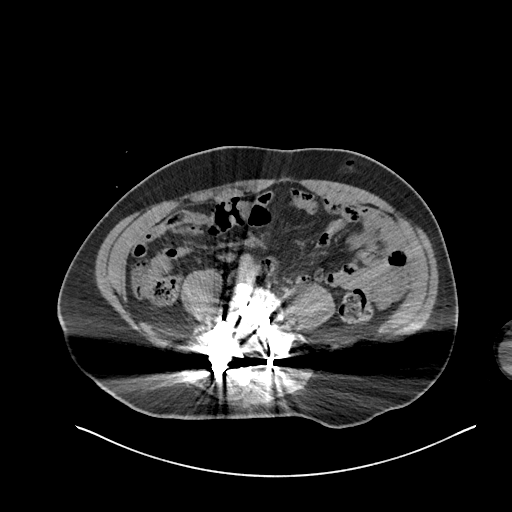
[im 56/103  soft-tissue]
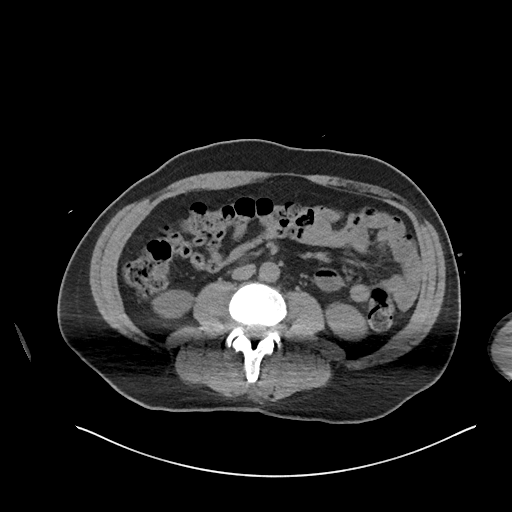
[im 64/103  soft-tissue]
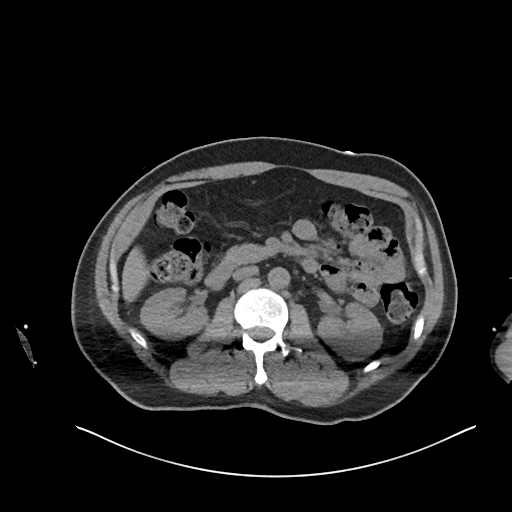
[im 73/103  soft-tissue]
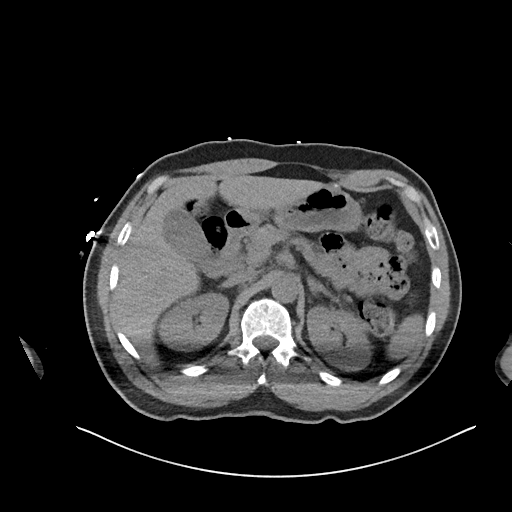
[im 73/103  bone]
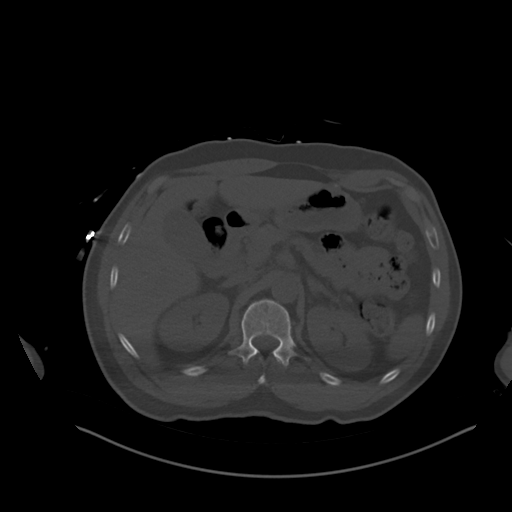
[im 81/103  soft-tissue]
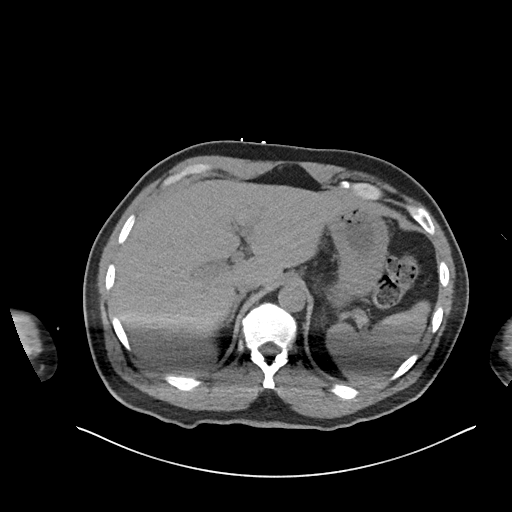
[im 90/103  soft-tissue]
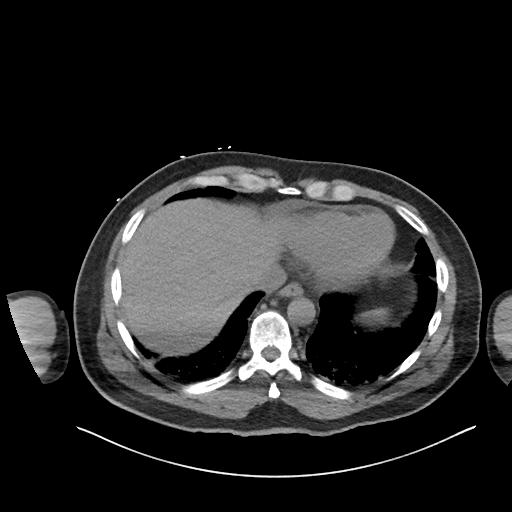
[im 98/103  soft-tissue]
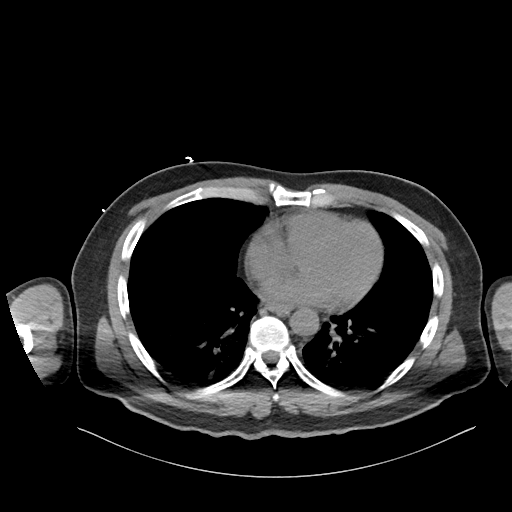

[Series 6: cor st · coronal · 0.86mm/px · 3 of 112 slices shown]
[im 38/112  soft-tissue]
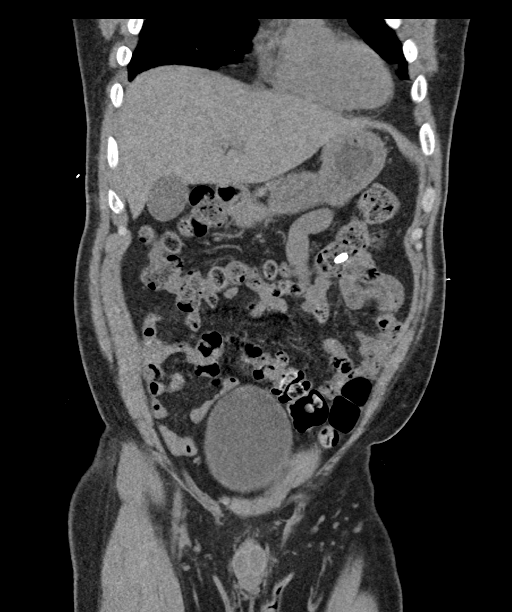
[im 50/112  soft-tissue]
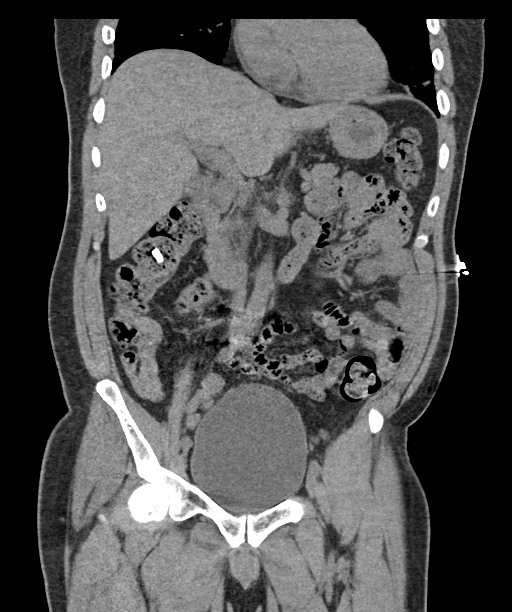
[im 62/112  soft-tissue]
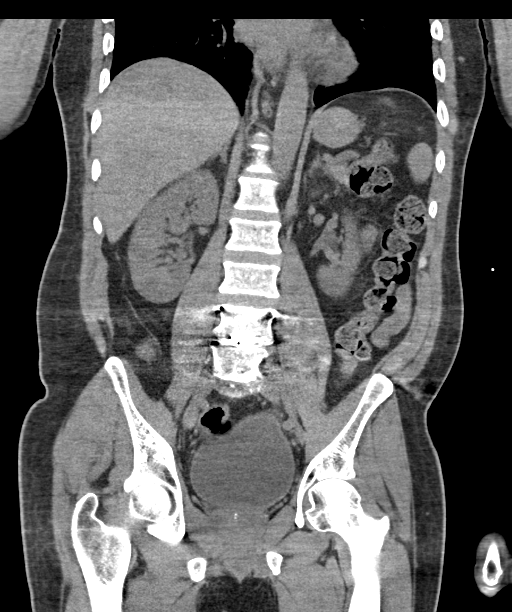

[15 of 46 positions shown; findings below may reference images not displayed]

FINDINGS: Lower chest: There is bibasilar atelectatic change. On axial slice
13 series 4, there is a nodular opacity in the lateral segment of
the left lower lobe measuring 8 x 7 mm. No consolidation in the lung
bases evident.

Hepatobiliary: No focal liver lesions are demonstrable on this
noncontrast enhanced study. The gallbladder wall is not appreciably
thickened. There is no biliary duct dilatation evident by CT. No
biliary duct mass or calculus demonstrable by CT.

Pancreas: There is no appreciable pancreatic mass or inflammatory
focus.

Spleen: No splenic lesions are evident.

Adrenals/Urinary Tract: Adrenals bilaterally appear unremarkable.
There is a 7 mm cyst in lower pole of the right kidney. There is no
appreciable hydronephrosis on either side. There is no renal or
ureteral calculus on either side. Urinary bladder is midline with
wall thickness within normal limits.

Stomach/Bowel: There is no appreciable bowel wall or mesenteric
thickening. There is no evident bowel obstruction. Terminal ileum
appears unremarkable. There is no demonstrable free air or portal
venous air.

Vascular/Lymphatic: No abdominal aortic aneurysm evident. No
vascular calcifications appreciable on this noncontrast enhanced
study. There is no adenopathy in the abdomen or pelvis.

Reproductive: There are scattered prostate calculi. Prostate and
seminal vesicles are normal in size and contour. No pelvic mass
evident.

Other: Appendix appears normal. There is no abscess or ascites in
the abdomen or pelvis.

Musculoskeletal: Postoperative changes are noted from L4-S1 with
support hardware intact. There are no blastic or lytic bone lesions.
There is no intramuscular or abdominal wall lesion evident.
IMPRESSION: 1. 8 x 7 mm nodular opacity in the left lower lobe. Non-contrast
chest CT at 6-12 months is recommended. If the nodule is stable at
time of repeat CT, then future CT at 18-24 months (from today's
scan) is considered optional for low-risk patients, but is
recommended for high-risk patients. This recommendation follows the
consensus statement: Guidelines for Management of Incidental
Pulmonary Nodules Detected on CT Images: From the [HOSPITAL]

2. No evident bowel obstruction or bowel wall thickening. No abscess
in the abdomen or pelvis. Appendix appears normal.

3. No evident renal or ureteral calculus. No hydronephrosis. Urinary
bladder wall thickness normal. Scattered prostatic calculi noted.

4.  No adenopathy evident in the abdomen or pelvis.

5. Postoperative changes in the lower lumbar and upper sacral
regions present.

## 2020-09-26 DIAGNOSIS — J431 Panlobular emphysema: Secondary | ICD-10-CM | POA: Insufficient documentation

## 2021-06-10 DIAGNOSIS — M542 Cervicalgia: Secondary | ICD-10-CM | POA: Insufficient documentation

## 2021-06-10 DIAGNOSIS — M5416 Radiculopathy, lumbar region: Secondary | ICD-10-CM | POA: Insufficient documentation

## 2021-06-10 DIAGNOSIS — Z981 Arthrodesis status: Secondary | ICD-10-CM | POA: Insufficient documentation

## 2021-10-08 DIAGNOSIS — M961 Postlaminectomy syndrome, not elsewhere classified: Secondary | ICD-10-CM | POA: Insufficient documentation

## 2021-12-30 DIAGNOSIS — G5622 Lesion of ulnar nerve, left upper limb: Secondary | ICD-10-CM | POA: Insufficient documentation

## 2022-01-13 DIAGNOSIS — G5601 Carpal tunnel syndrome, right upper limb: Secondary | ICD-10-CM | POA: Insufficient documentation

## 2022-03-14 ENCOUNTER — Encounter: Payer: Self-pay | Admitting: Pulmonary Disease

## 2022-03-14 ENCOUNTER — Ambulatory Visit: Payer: Medicare HMO | Admitting: Pulmonary Disease

## 2022-03-14 VITALS — BP 118/70 | HR 100 | Ht 68.5 in | Wt 210.0 lb

## 2022-03-14 DIAGNOSIS — Z720 Tobacco use: Secondary | ICD-10-CM | POA: Diagnosis not present

## 2022-03-14 DIAGNOSIS — M7061 Trochanteric bursitis, right hip: Secondary | ICD-10-CM | POA: Insufficient documentation

## 2022-03-14 DIAGNOSIS — J449 Chronic obstructive pulmonary disease, unspecified: Secondary | ICD-10-CM | POA: Diagnosis not present

## 2022-03-14 DIAGNOSIS — J438 Other emphysema: Secondary | ICD-10-CM | POA: Diagnosis not present

## 2022-03-14 MED ORDER — PREDNISONE 20 MG PO TABS: 20.0000 mg | ORAL_TABLET | Freq: Every day | ORAL | 0 refills | Status: AC

## 2022-03-14 MED ORDER — LEVOFLOXACIN 500 MG PO TABS: 500.0000 mg | ORAL_TABLET | Freq: Every day | ORAL | 0 refills | Status: DC

## 2022-03-14 MED ORDER — STIOLTO RESPIMAT 2.5-2.5 MCG/ACT IN AERS: 2.0000 | INHALATION_SPRAY | Freq: Every day | RESPIRATORY_TRACT | 3 refills | Status: AC

## 2022-03-14 NOTE — Patient Instructions (Addendum)
Levaquin  Course of prednisone  Stiolto inhaler-to be used daily  Continue using albuterol as needed  Record release for recent CT scan to be loaded to the system or actual films to be reviewed by myself  Schedule for PFT at next visit  Continue to work on quitting smoking  I will see you back in 3 months

## 2022-03-14 NOTE — Progress Notes (Signed)
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   cloNIDine (CATAPRES) 0.1 MG tablet Take 0.5 tablets (0.05 mg total) by mouth 2 (two) times daily.   cloZAPine (CLOZARIL) 100 MG tablet Take 400 mg by mouth at bedtime.   diphenhydrAMINE HCl 50 MG/30ML LIQD Take 50 mg by mouth at bedtime.   docusate sodium (COLACE) 100 MG capsule Take 1 capsule (100 mg total) by mouth 2 (two) times daily. For constipation   fluticasone (FLONASE) 50 MCG/ACT nasal spray Place 1 spray into both nostrils daily.   gabapentin (NEURONTIN) 800 MG tablet Take 1 tablet (800 mg total) by mouth 3 (three) times daily.   glipiZIDE (GLUCOTROL) 5 MG tablet Take 1 tablet (5 mg total) by mouth daily before breakfast.   insulin aspart (NOVOLOG FLEXPEN) 100 UNIT/ML FlexPen Sliding scale CBG 70 - 120: 0 units CBG 121 - 150: 1 unit,  CBG 151 - 200: 2 units,  CBG 201 - 250: 3 units,  CBG 251 - 300: 5 units,  CBG 301 - 350: 7 units,  CBG 351 - 400: 9 units   CBG > 400: 9 units and notify your MD   insulin glargine (LANTUS) 100  unit/mL SOPN Inject 0.15 mLs (15 Units total) into the skin daily.   Insulin Pen Needle 32G X 4 MM MISC Inject 15 Units into the skin daily. And novolog sliding scale   lisinopril (ZESTRIL) 5 MG tablet Take 1 tablet (5 mg total) by mouth daily.   Melatonin 10 MG TABS Take 20 mg by mouth at bedtime.   metFORMIN (GLUCOPHAGE) 1000 MG tablet Take 1 tablet (1,000 mg total) by mouth 2 (two) times daily with a meal.   Omega-3 Fatty Acids (FISH OIL PO) Take 1 capsule by mouth daily.   omeprazole (PRILOSEC) 40 MG capsule Take 1 capsule (40 mg total) by mouth daily.   polyethylene glycol (MIRALAX) 17 g packet Take 17 g by mouth daily as needed for mild constipation or moderate constipation.   prazosin (MINIPRESS) 5 MG capsule Take 1 capsule (5 mg total) by mouth at bedtime.   sertraline (ZOLOFT) 100 MG tablet Take 100 mg by mouth daily.   sertraline (ZOLOFT) 50 MG tablet Take 50 mg by mouth daily.   traZODone (DESYREL) 100 MG tablet Take 100 mg by mouth at bedtime.   No facility-administered encounter medications on file as of 03/14/2022.    Allergies as of 03/14/2022 - Review Complete 06/19/2018  Allergen Reaction Noted   Contrast media [iodinated contrast media] Hives 06/18/2018    Past Medical History:  Diagnosis Date   Cholecystitis    Chronic pain    Depression    Diabetes mellitus without complication (HCC)    Insomnia    Memory loss    PTSD (post-traumatic stress disorder)    Renal disorder    Schizophrenia ([place])     Past Surgical History:  Procedure Laterality Date   CERVICAL FUSION     LUMBAR FUSION      Family History  Problem Relation Age of Onset   Hypertension Mother    Diabetes Father     Social History   Socioeconomic History   Marital status: Married    Spouse name: Not on file   Number of children: Not on file   Years of education: Not on file   Highest education level: Not on file  Occupational History   Not on file  Tobacco Use   Smoking status: Every  Day    Packs/day: 1.50    Types: Cigarettes   Smokeless tobacco: Never  Vaping Use   Vaping Use: Never used  Substance and Sexual Activity   Alcohol use: Not Currently   Drug use: Not Currently    Types: Cocaine   Sexual activity: Not Currently  Other Topics Concern   Not on file  Social History Narrative   Not on file   Social Determinants of Health   Financial Resource Strain: Not on file  Food Insecurity: Not on file  Transportation Needs: Not on file  Physical Activity: Not on file  Stress: Not on file  Social Connections: Not on file  Intimate Partner Violence: Not on file    Review of Systems  Respiratory:  Positive for cough, shortness of breath and wheezing.     There were no vitals filed for this visit.   Physical Exam Constitutional:      Appearance: He is obese.  HENT:     Head: Normocephalic.     Nose: Nose normal.     Mouth/Throat:     Mouth: Mucous membranes are moist.  Eyes:     General: Scleral icterus present.     Pupils: Pupils are equal, round, and reactive to light.  Cardiovascular:     Rate and Rhythm: Normal rate and regular rhythm.     Heart sounds: No murmur heard.    No friction rub.  Pulmonary:     Effort: Pulmonary effort is normal. No respiratory distress.     Breath sounds: No stridor. Rhonchi present. No wheezing.  Musculoskeletal:     Cervical back: No rigidity or tenderness.  Neurological:     Mental Status: He is alert.  Psychiatric:        Mood and Affect: Mood normal.    Data Reviewed: Report of recent chest x-ray reviewed  Report of recent CT with multiple lung nodules which spouse states has been stable for many years  Assessment:  Emphysema  Bronchitis  Significant exacerbation of symptoms with increased sputum production, increasing shortness of breath  Risk with continued to smoke discussed extensively with the patient  Multiple lung nodules that have been stable as per report by spouse -At least over 4  years  Plan/Recommendations: Schedule for PFT at next visit  Continue to work on quitting smoking  Obtain record release to have copy of CT scan loaded into our system so I can review it   Prescription for Levaquin sent to pharmacy  Short course of steroids for 7 days  Prescription for Stiolto sent to pharmacy  I  did counsel him that he has a lot to gain with being able to quit smoking, he will continue to work on trying to quit  Sherrilyn Rist MD [facility] 03/14/2022, 1:02 PM  CC: Bernerd Limbo, MD

## 2022-03-26 DIAGNOSIS — G5622 Lesion of ulnar nerve, left upper limb: Secondary | ICD-10-CM | POA: Insufficient documentation

## 2022-05-06 ENCOUNTER — Other Ambulatory Visit: Payer: Self-pay

## 2022-05-06 ENCOUNTER — Encounter (HOSPITAL_COMMUNITY): Payer: Self-pay | Admitting: Ophthalmology

## 2022-05-06 ENCOUNTER — Ambulatory Visit (HOSPITAL_COMMUNITY): Payer: Medicare HMO | Admitting: Anesthesiology

## 2022-05-06 ENCOUNTER — Ambulatory Visit (HOSPITAL_BASED_OUTPATIENT_CLINIC_OR_DEPARTMENT_OTHER): Payer: Medicare HMO | Admitting: Anesthesiology

## 2022-05-06 ENCOUNTER — Encounter (HOSPITAL_COMMUNITY)
Admission: RE | Disposition: A | Discharge status: Home / Self Care | Payer: Self-pay | Source: Ambulatory Visit | Attending: Ophthalmology

## 2022-05-06 ENCOUNTER — Ambulatory Visit (HOSPITAL_COMMUNITY)
Admission: RE | Admit: 2022-05-06 | Discharge: 2022-05-06 | Disposition: A | Discharge status: Home / Self Care | Payer: Medicare HMO | Source: Ambulatory Visit | Attending: Ophthalmology | Admitting: Ophthalmology

## 2022-05-06 DIAGNOSIS — H43392 Other vitreous opacities, left eye: Secondary | ICD-10-CM | POA: Insufficient documentation

## 2022-05-06 DIAGNOSIS — E119 Type 2 diabetes mellitus without complications: Secondary | ICD-10-CM | POA: Insufficient documentation

## 2022-05-06 DIAGNOSIS — Z7984 Long term (current) use of oral hypoglycemic drugs: Secondary | ICD-10-CM | POA: Insufficient documentation

## 2022-05-06 DIAGNOSIS — Z79899 Other long term (current) drug therapy: Secondary | ICD-10-CM | POA: Diagnosis not present

## 2022-05-06 DIAGNOSIS — K219 Gastro-esophageal reflux disease without esophagitis: Secondary | ICD-10-CM | POA: Insufficient documentation

## 2022-05-06 DIAGNOSIS — Z01818 Encounter for other preprocedural examination: Secondary | ICD-10-CM

## 2022-05-06 DIAGNOSIS — F17210 Nicotine dependence, cigarettes, uncomplicated: Secondary | ICD-10-CM | POA: Diagnosis not present

## 2022-05-06 DIAGNOSIS — H44002 Unspecified purulent endophthalmitis, left eye: Secondary | ICD-10-CM

## 2022-05-06 DIAGNOSIS — Z794 Long term (current) use of insulin: Secondary | ICD-10-CM | POA: Insufficient documentation

## 2022-05-06 DIAGNOSIS — I10 Essential (primary) hypertension: Secondary | ICD-10-CM | POA: Diagnosis not present

## 2022-05-06 HISTORY — PX: PARS PLANA VITRECTOMY: SHX2166

## 2022-05-06 LAB — CBC
HCT: 31.6 % — ABNORMAL LOW (ref 39.0–52.0)
Hemoglobin: 10.7 g/dL — ABNORMAL LOW (ref 13.0–17.0)
MCH: 31.4 pg (ref 26.0–34.0)
MCHC: 33.9 g/dL (ref 30.0–36.0)
MCV: 92.7 fL (ref 80.0–100.0)
Platelets: 340 10*3/uL (ref 150–400)
RBC: 3.41 MIL/uL — ABNORMAL LOW (ref 4.22–5.81)
RDW: 13.9 % (ref 11.5–15.5)
WBC: 10.6 10*3/uL — ABNORMAL HIGH (ref 4.0–10.5)
nRBC: 0 % (ref 0.0–0.2)

## 2022-05-06 LAB — BASIC METABOLIC PANEL
Anion gap: 12 (ref 5–15)
BUN: 16 mg/dL (ref 6–20)
CO2: 23 mmol/L (ref 22–32)
Calcium: 9.3 mg/dL (ref 8.9–10.3)
Chloride: 102 mmol/L (ref 98–111)
Creatinine, Ser: 1.23 mg/dL (ref 0.61–1.24)
GFR, Estimated: >60 mL/min (ref >60)
Glucose, Bld: 190 mg/dL — ABNORMAL HIGH (ref 70–99)
Potassium: 4 mmol/L (ref 3.5–5.1)
Sodium: 137 mmol/L (ref 135–145)

## 2022-05-06 LAB — GLUCOSE, CAPILLARY
Glucose-Capillary: 217 mg/dL — ABNORMAL HIGH (ref 70–99)
Glucose-Capillary: 72 mg/dL (ref 70–99)

## 2022-05-06 SURGERY — PARS PLANA VITRECTOMY 25 GAUGE FOR ENDOPHTHALMITIS
Anesthesia: Monitor Anesthesia Care | Site: Eye | Laterality: Left

## 2022-05-06 MED ORDER — 0.9 % SODIUM CHLORIDE (POUR BTL) OPTIME
TOPICAL | Status: DC | PRN
  Administered 2022-05-06: 200 mL

## 2022-05-06 MED ORDER — BSS IO SOLN
INTRAOCULAR | Status: AC
  Filled 2022-05-06: qty 15

## 2022-05-06 MED ORDER — CHLORHEXIDINE GLUCONATE 0.12 % MT SOLN
OROMUCOSAL | Status: AC
  Filled 2022-05-06: qty 15

## 2022-05-06 MED ORDER — PROPOFOL 10 MG/ML IV BOLUS
INTRAVENOUS | Status: DC | PRN
  Administered 2022-05-06: 20 mg via INTRAVENOUS

## 2022-05-06 MED ORDER — BUPIVACAINE HCL (PF) 0.75 % IJ SOLN
INTRAMUSCULAR | Status: AC
  Filled 2022-05-06: qty 10

## 2022-05-06 MED ORDER — INSULIN ASPART 100 UNIT/ML IJ SOLN
INTRAMUSCULAR | Status: AC
  Administered 2022-05-06: 4 [IU] via SUBCUTANEOUS
  Filled 2022-05-06: qty 1

## 2022-05-06 MED ORDER — CEFTAZIDIME INTRAVITREAL INJECTION 2.25 MG/0.1 ML
18.0000 mg | INTRAVITREAL | Status: DC
  Filled 2022-05-06: qty 0.8

## 2022-05-06 MED ORDER — DEXAMETHASONE SODIUM PHOSPHATE 10 MG/ML IJ SOLN
INTRAMUSCULAR | Status: AC
  Filled 2022-05-06: qty 1

## 2022-05-06 MED ORDER — FENTANYL CITRATE (PF) 100 MCG/2ML IJ SOLN: 25.0000 ug | INTRAMUSCULAR | Status: DC | PRN

## 2022-05-06 MED ORDER — FENTANYL CITRATE (PF) 250 MCG/5ML IJ SOLN
INTRAMUSCULAR | Status: AC
  Filled 2022-05-06: qty 5

## 2022-05-06 MED ORDER — INDOCYANINE GREEN 25 MG IV SOLR
INTRAVENOUS | Status: AC
  Filled 2022-05-06: qty 10

## 2022-05-06 MED ORDER — CEFTAZIDIME INTRAVITREAL INJECTION 2.25 MG/0.1 ML
INTRAVITREAL | Status: DC | PRN
  Administered 2022-05-06: 18 mg via INTRAVITREAL

## 2022-05-06 MED ORDER — SODIUM HYALURONATE 10 MG/ML IO SOLUTION
PREFILLED_SYRINGE | INTRAOCULAR | Status: AC
  Filled 2022-05-06: qty 0.85

## 2022-05-06 MED ORDER — INSULIN ASPART 100 UNIT/ML IJ SOLN: 0.0000 [IU] | INTRAMUSCULAR | Status: DC | PRN

## 2022-05-06 MED ORDER — PROMETHAZINE HCL 25 MG/ML IJ SOLN: 6.2500 mg | INTRAMUSCULAR | Status: DC | PRN

## 2022-05-06 MED ORDER — PREDNISOLONE ACETATE 1 % OP SUSP
1.0000 [drp] | OPHTHALMIC | Status: DC
  Filled 2022-05-06 (×2): qty 1

## 2022-05-06 MED ORDER — ATROPINE SULFATE 1 % OP SOLN
OPHTHALMIC | Status: AC
  Filled 2022-05-06: qty 5

## 2022-05-06 MED ORDER — PROPOFOL 10 MG/ML IV BOLUS
INTRAVENOUS | Status: AC
  Filled 2022-05-06: qty 20

## 2022-05-06 MED ORDER — VANCOMYCIN SUBCONJUNCTIVAL INJECTION 25 MG/0.5 ML
INTRAOCULAR | Status: DC | PRN
  Administered 2022-05-06: 8 mg

## 2022-05-06 MED ORDER — SODIUM CHLORIDE 0.9 % IV SOLN: INTRAVENOUS | Status: DC

## 2022-05-06 MED ORDER — LIDOCAINE HCL 2 % IJ SOLN
INTRAMUSCULAR | Status: DC | PRN
  Administered 2022-05-06: 5 mL via RETROBULBAR

## 2022-05-06 MED ORDER — TOBRAMYCIN-DEXAMETHASONE 0.3-0.1 % OP OINT
TOPICAL_OINTMENT | OPHTHALMIC | Status: AC
  Filled 2022-05-06: qty 3.5

## 2022-05-06 MED ORDER — DEXAMETHASONE SODIUM PHOSPHATE 10 MG/ML IJ SOLN
INTRAMUSCULAR | Status: DC | PRN
  Administered 2022-05-06: 10 mg

## 2022-05-06 MED ORDER — ONDANSETRON HCL 4 MG/2ML IJ SOLN
INTRAMUSCULAR | Status: AC
  Filled 2022-05-06: qty 2

## 2022-05-06 MED ORDER — TROPICAMIDE 1 % OP SOLN
1.0000 [drp] | OPHTHALMIC | Status: DC | PRN
  Filled 2022-05-06: qty 15

## 2022-05-06 MED ORDER — EPINEPHRINE PF 1 MG/ML IJ SOLN
INTRAOCULAR | Status: DC | PRN
  Administered 2022-05-06: .3 mL

## 2022-05-06 MED ORDER — MIDAZOLAM HCL 2 MG/2ML IJ SOLN
INTRAMUSCULAR | Status: DC | PRN
  Administered 2022-05-06: 1 mg via INTRAVENOUS

## 2022-05-06 MED ORDER — STERILE WATER FOR IRRIGATION IR SOLN
Status: DC | PRN
  Administered 2022-05-06: 200 mL

## 2022-05-06 MED ORDER — VANCOMYCIN INTRAVITREAL INJECTION 1 MG/0.1 ML
8.0000 mg | INTRAOCULAR | Status: DC
  Filled 2022-05-06: qty 0.8

## 2022-05-06 MED ORDER — SIGHTPATH DOSE#1 SODIUM HYALURONATE 10 MG/ML IO SOLUTION
PREFILLED_SYRINGE | INTRAOCULAR | Status: DC | PRN
  Administered 2022-05-06: .85 mL via INTRAOCULAR

## 2022-05-06 MED ORDER — HYALURONIDASE HUMAN 150 UNIT/ML IJ SOLN
INTRAMUSCULAR | Status: AC
  Filled 2022-05-06: qty 1

## 2022-05-06 MED ORDER — EPINEPHRINE PF 1 MG/ML IJ SOLN
INTRAMUSCULAR | Status: AC
  Filled 2022-05-06: qty 1

## 2022-05-06 MED ORDER — LIDOCAINE HCL 2 % IJ SOLN
INTRAMUSCULAR | Status: AC
  Filled 2022-05-06: qty 20

## 2022-05-06 MED ORDER — PHENYLEPHRINE HCL 2.5 % OP SOLN
1.0000 [drp] | OPHTHALMIC | Status: DC | PRN
  Filled 2022-05-06: qty 2

## 2022-05-06 MED ORDER — ORAL CARE MOUTH RINSE: 15.0000 mL | Freq: Once | OROMUCOSAL | Status: DC

## 2022-05-06 MED ORDER — TOBRAMYCIN-DEXAMETHASONE 0.3-0.1 % OP OINT
TOPICAL_OINTMENT | OPHTHALMIC | Status: DC | PRN
  Administered 2022-05-06: 1 via OPHTHALMIC

## 2022-05-06 MED ORDER — PHENYLEPHRINE HCL 10 % OP SOLN
1.0000 [drp] | OPHTHALMIC | Status: DC | PRN
  Filled 2022-05-06: qty 5

## 2022-05-06 MED ORDER — MIDAZOLAM HCL 2 MG/2ML IJ SOLN
INTRAMUSCULAR | Status: AC
  Filled 2022-05-06: qty 2

## 2022-05-06 MED ORDER — PROPARACAINE HCL 0.5 % OP SOLN
1.0000 [drp] | OPHTHALMIC | Status: DC | PRN
  Filled 2022-05-06: qty 15

## 2022-05-06 MED ORDER — BSS IO SOLN
INTRAOCULAR | Status: DC | PRN
  Administered 2022-05-06: 15 mL via INTRAOCULAR

## 2022-05-06 MED ORDER — BSS PLUS IO SOLN
INTRAOCULAR | Status: DC | PRN
  Administered 2022-05-06: 1 via INTRAOCULAR

## 2022-05-06 MED ORDER — GATIFLOXACIN 0.5 % OP SOLN
1.0000 [drp] | OPHTHALMIC | Status: DC | PRN
  Filled 2022-05-06: qty 2.5

## 2022-05-06 MED ORDER — HYALURONIDASE HUMAN 150 UNIT/ML IJ SOLN
INTRAMUSCULAR | Status: DC | PRN
  Administered 2022-05-06: 150 [IU]

## 2022-05-06 MED ORDER — FENTANYL CITRATE (PF) 250 MCG/5ML IJ SOLN
INTRAMUSCULAR | Status: DC | PRN
  Administered 2022-05-06: 50 ug via INTRAVENOUS

## 2022-05-06 MED ORDER — BSS PLUS IO SOLN
INTRAOCULAR | Status: AC
  Filled 2022-05-06: qty 500

## 2022-05-06 MED ORDER — CHLORHEXIDINE GLUCONATE 0.12 % MT SOLN: 15.0000 mL | Freq: Once | OROMUCOSAL | Status: DC

## 2022-05-06 SURGICAL SUPPLY — 65 items
APPLICATOR COTTON TIP 6 STRL (MISCELLANEOUS) ×1 IMPLANT
APPLICATOR COTTON TIP 6IN STRL (MISCELLANEOUS) ×2 IMPLANT
BAG COUNTER SPONGE SURGICOUNT (BAG) ×1 IMPLANT
BAND WRIST GAS GREEN (MISCELLANEOUS) IMPLANT
BLADE MVR KNIFE 20G (BLADE) IMPLANT
CANNULA ANT CHAM MAIN (OPHTHALMIC RELATED) IMPLANT
CANNULA DUAL BORE 23G (CANNULA) IMPLANT
CANNULA DUALBORE 25G (CANNULA) IMPLANT
CANNULA VLV SOFT TIP 25G (OPHTHALMIC) ×1 IMPLANT
CANNULA VLV SOFT TIP 25GA (OPHTHALMIC) ×1 IMPLANT
CAUTERY EYE LOW TEMP 1300F FIN (OPHTHALMIC RELATED) IMPLANT
CLSR STERI-STRIP ANTIMIC 1/2X4 (GAUZE/BANDAGES/DRESSINGS) ×1 IMPLANT
CORD BIPOLAR FORCEPS 12FT (ELECTRODE) IMPLANT
COVER MAYO STAND STRL (DRAPES) IMPLANT
DRAPE HALF SHEET 40X57 (DRAPES) ×1 IMPLANT
DRAPE INCISE 51X51 W/FILM STRL (DRAPES) IMPLANT
DRAPE RETRACTOR (MISCELLANEOUS) ×1 IMPLANT
ERASER HMR WETFIELD 23G BP (MISCELLANEOUS) IMPLANT
FORCEPS ECKARDT ILM 25G SERR (OPHTHALMIC RELATED) IMPLANT
FORCEPS GRIESHABER ILM 25G A (INSTRUMENTS) IMPLANT
GAS AUTO FILL CONSTEL (OPHTHALMIC)
GAS AUTO FILL CONSTELLATION (OPHTHALMIC) IMPLANT
GAS WRIST BAND GREEN (MISCELLANEOUS)
GLOVE SURG SYN 7.5  E (GLOVE) ×1
GLOVE SURG SYN 7.5 E (GLOVE) ×1 IMPLANT
GLOVE SURG SYN 7.5 PF PI (GLOVE) ×2 IMPLANT
GOWN STRL REUS W/ TWL LRG LVL3 (GOWN DISPOSABLE) ×1 IMPLANT
GOWN STRL REUS W/TWL LRG LVL3 (GOWN DISPOSABLE) ×2
HANDLE PNEUMATIC FOR CONSTEL (OPHTHALMIC) IMPLANT
KIT BASIN OR (CUSTOM PROCEDURE TRAY) ×1 IMPLANT
KIT TURNOVER KIT B (KITS) ×1 IMPLANT
LENS BIOM SUPER VIEW SET DISP (MISCELLANEOUS) ×1 IMPLANT
MICROPICK 25G (MISCELLANEOUS)
NDL 18GX1X1/2 (RX/OR ONLY) (NEEDLE) ×1 IMPLANT
NDL 25GX 5/8IN NON SAFETY (NEEDLE) ×1 IMPLANT
NDL 27GX1/2 REG BEVEL ECLIP (NEEDLE) ×1 IMPLANT
NDL FILTER BLUNT 18X1 1/2 (NEEDLE) ×3 IMPLANT
NDL HYPO 25GX1X1/2 BEV (NEEDLE) IMPLANT
NDL HYPO 30X.5 LL (NEEDLE) ×4 IMPLANT
NDL RETROBULBAR 25GX1.5 (NEEDLE) IMPLANT
NEEDLE 18GX1X1/2 (RX/OR ONLY) (NEEDLE) ×2 IMPLANT
NEEDLE 25GX 5/8IN NON SAFETY (NEEDLE) IMPLANT
NEEDLE 27GX1/2 REG BEVEL ECLIP (NEEDLE) IMPLANT
NEEDLE FILTER BLUNT 18X1 1/2 (NEEDLE) ×3 IMPLANT
NEEDLE HYPO 25GX1X1/2 BEV (NEEDLE) IMPLANT
NEEDLE HYPO 30X.5 LL (NEEDLE) ×1 IMPLANT
NEEDLE RETROBULBAR 25GX1.5 (NEEDLE) ×1 IMPLANT
NS IRRIG 1000ML POUR BTL (IV SOLUTION) ×1 IMPLANT
PACK VITRECTOMY CUSTOM (CUSTOM PROCEDURE TRAY) ×1 IMPLANT
PAD ARMBOARD 7.5X6 YLW CONV (MISCELLANEOUS) ×2 IMPLANT
PAK PIK VITRECTOMY CVS 25GA (OPHTHALMIC) ×1 IMPLANT
PICK MICROPICK 25G (MISCELLANEOUS) IMPLANT
PROBE ENDO DIATHERMY 25G (MISCELLANEOUS) IMPLANT
PROBE LASER ILLUM FLEX CVD 25G (OPHTHALMIC) IMPLANT
ROLLS DENTAL (MISCELLANEOUS) IMPLANT
SCRAPER DIAMOND 25GA (OPHTHALMIC RELATED) IMPLANT
SOL ANTI FOG 6CC (MISCELLANEOUS) ×1 IMPLANT
STOPCOCK 4 WAY LG BORE MALE ST (IV SETS) IMPLANT
SUT VICRYL 7 0 TG140 8 (SUTURE) ×1 IMPLANT
SYR 10ML LL (SYRINGE) IMPLANT
SYR 20ML LL LF (SYRINGE) ×1 IMPLANT
SYR 5ML LL (SYRINGE) ×1 IMPLANT
SYR TB 1ML LUER SLIP (SYRINGE) ×3 IMPLANT
TOWEL GREEN STERILE FF (TOWEL DISPOSABLE) ×1 IMPLANT
WATER STERILE IRR 1000ML POUR (IV SOLUTION) ×1 IMPLANT

## 2022-05-06 NOTE — Op Note (Signed)
Bryan Carlson 05/06/2022 Diagnosis: Endophthalmitis and vitreous opacities left eye  Procedure: Pars Plana Vitrectomy and vitreous biopsy, injection of intravitreal antibiotics Operative Eye:  left eye  Surgeon: Harrold Donath Estimated Blood Loss: minimal Specimens for Pathology:  None Complications: none   The  patient was prepped and draped in the usual fashion for ocular surgery on the  left eye .  A lid speculum was placed.  Infusion line and trocar was placed at the 4 o'clock position approximately 3.5 mm from the surgical limbus.   The infusion line was allowed to run and then clamped when placed at the cannula opening. The line was inserted and secured to the drape with an adhesive strip.   Active trocars/cannula were placed at the 10 and 2 o'clock positions approximately 3.5 mm from the surgical limbus. The cannula was visualized in the vitreous cavity.  The light pipe and vitreous cutter were inserted into the vitreous cavity and using a 3cc syringe on the aspiration line of the vitrector, a dry vitreous tap was performed and sent to Microbiology for gram stain and culture.  A core vitrectomy was then performed.  Care taken to remove the vitreous up to the vitreous base for 360 degrees.   A partial air-fluid exchange was performed.  Intravitreal antibiotics were placed: 1mg  Vancymycin in 0.61ml and 2.25mg  Ceftazadime in 0.47ml.  The superior cannulas were sequentially removed with concommitant tamponade using a cotton tipped applicator and noted to be air tight.  The infusion line and trocar were removed and the sclerotomy was noted to be air tight with normal intraocular pressure by digital palpapation.  Subconjunctival injections of  Ceftazadime, Vancomycin and Dexamethasone 4mg /71ml was placed in the infero-medial quadrant.   The speculum and drapes were removed and the eye was patched with Polymixin/Bacitracin ophthalmic ointment. An eye shield was placed and the patient was  transferred alert and conversant with stable vital signs to the post operative recovery area.  The patient tolerated the procedure well and no complications were noted.  Harrold Donath MD

## 2022-05-06 NOTE — H&P (Signed)
Date of examination:  05/06/2022  Indication for surgery: Endophthalmitis left eye  Pertinent past medical history:  Past Medical History:  Diagnosis Date   Cholecystitis    Chronic pain    Depression    Diabetes mellitus without complication (HCC)    Insomnia    Memory loss    PTSD (post-traumatic stress disorder)    Renal disorder    Schizophrenia (HCC)     Pertinent ocular history:  endophthalmitis left eye with vision loss  Pertinent family history:  Family History  Problem Relation Age of Onset   Hypertension Mother    Diabetes Father     General:  Healthy appearing patient in no distress.    Eyes:    Acuity OS CF    External: Within normal limits    Anterior segment: Within normal limits       Fundus: posterior hypopyon with hazy view   Impression: Endophthalmitis left eye  Plan: Vitrectomy, vitreous tap and injection of intravitreal antibiotics  Carmela Rima, MD

## 2022-05-06 NOTE — Progress Notes (Signed)
1930 Dr Allena Katz done eye drops.

## 2022-05-06 NOTE — Discharge Instructions (Signed)
DO NOT SLEEP ON BACK, THE EYE PRESSURE CAN GO UP AND CAUSE EYE PAIN AND POSSIBLE VISION LOSS  SLEEP ON SIDE WITH NOSE TO PILLOW  DURING DAY KEEP UPRIGHT

## 2022-05-06 NOTE — Anesthesia Preprocedure Evaluation (Addendum)
Anesthesia Evaluation  Patient identified by MRN, date of birth, ID band Patient awake    Reviewed: Allergy & Precautions, NPO status , Patient's Chart, lab work & pertinent test results  Airway Mallampati: III  TM Distance: >3 FB Neck ROM: Full    Dental  (+) Dental Advisory Given, Edentulous Upper, Poor Dentition, Missing   Pulmonary asthma , Current Smoker   Pulmonary exam normal breath sounds clear to auscultation       Cardiovascular hypertension, Pt. on medications Normal cardiovascular exam Rhythm:Regular Rate:Normal     Neuro/Psych  PSYCHIATRIC DISORDERS Anxiety Depression  Schizophrenia Dementia LEFT EYE ENDOPHTHALMITIS negative neurological ROS     GI/Hepatic Neg liver ROS,GERD  Medicated,,  Endo/Other  diabetes, Type 2, Oral Hypoglycemic Agents, Insulin Dependent    Renal/GU Renal disease     Musculoskeletal negative musculoskeletal ROS (+)    Abdominal   Peds  Hematology negative hematology ROS (+)   Anesthesia Other Findings Day of surgery medications reviewed with the patient.  Reproductive/Obstetrics                             Anesthesia Physical Anesthesia Plan  ASA: 3 and emergent  Anesthesia Plan: Regional and MAC   Post-op Pain Management: Ofirmev IV (intra-op)*   Induction: Intravenous  PONV Risk Score and Plan: 1 and Midazolam and Ondansetron  Airway Management Planned: Natural Airway and Simple Face Mask  Additional Equipment:   Intra-op Plan:   Post-operative Plan:   Informed Consent: I have reviewed the patients History and Physical, chart, labs and discussed the procedure including the risks, benefits and alternatives for the proposed anesthesia with the patient or authorized representative who has indicated his/her understanding and acceptance.     Dental advisory given  Plan Discussed with: CRNA  Anesthesia Plan Comments:         Anesthesia Quick Evaluation

## 2022-05-06 NOTE — Brief Op Note (Signed)
05/06/2022  9:40 PM  PATIENT:  Bryan Carlson  57 y.o. male  PRE-OPERATIVE DIAGNOSIS:  LEFT EYE ENDOPHTHALMITIS,VITREOUS OPACITIES  POST-OPERATIVE DIAGNOSIS:  LEFT EYE ENDOPHTHALMITIS, VITREOUS OPACITIES  PROCEDURE:  Procedure(s): PARS PLANA VITRECTOMY 25 GAUGE FOR ENDOPHTHALMITIS ,VITREOUS BIOPSY AND INJECTION OF ANTIBIOTICS (Left)  SURGEON:  Surgeon(s) and Role:    * Carmela Rima, MD - Primary  PHYSICIAN ASSISTANT:   ASSISTANTS: none   ANESTHESIA:   local and MAC  EBL: <1cc  BLOOD ADMINISTERED:none  DRAINS: none   LOCAL MEDICATIONS USED:  MARCAINE    and LIDOCAINE   SPECIMEN:  Source of Specimen:  vitreous  DISPOSITION OF SPECIMEN:   microbiology  COUNTS:  YES  TOURNIQUET:  * No tourniquets in log *  DICTATION: .Note written in EPIC  PLAN OF CARE: Discharge to home after PACU  PATIENT DISPOSITION:  PACU - hemodynamically stable.   Delay start of Pharmacological VTE agent (>24hrs) due to surgical blood loss or risk of bleeding: not applicable

## 2022-05-06 NOTE — Anesthesia Procedure Notes (Signed)
Procedure Name: MAC Date/Time: 05/06/2022 8:37 PM  Performed by: Laruth Bouchard., CRNAPre-anesthesia Checklist: Patient identified, Emergency Drugs available, Patient being monitored, Timeout performed and Suction available Patient Re-evaluated:Patient Re-evaluated prior to induction Oxygen Delivery Method: Nasal cannula Preoxygenation: Pre-oxygenation with 100% oxygen Induction Type: IV induction Placement Confirmation: positive ETCO2

## 2022-05-07 ENCOUNTER — Encounter (HOSPITAL_COMMUNITY): Payer: Self-pay | Admitting: Ophthalmology

## 2022-05-07 LAB — ANAEROBIC CULTURE W GRAM STAIN

## 2022-05-07 NOTE — Anesthesia Postprocedure Evaluation (Signed)
Anesthesia Post Note  Patient: Ederson Shiao  Procedure(s) Performed: PARS PLANA VITRECTOMY 25 GAUGE FOR ENDOPHTHALMITIS ,VITREOUS BIOPSY AND INJECTION OF ANTIBIOTICS (Left: Eye)     Patient location during evaluation: PACU Anesthesia Type: MAC Level of consciousness: awake and alert Pain management: pain level controlled Vital Signs Assessment: post-procedure vital signs reviewed and stable Respiratory status: spontaneous breathing, nonlabored ventilation, respiratory function stable and patient connected to nasal cannula oxygen Cardiovascular status: stable and blood pressure returned to baseline Postop Assessment: no apparent nausea or vomiting Anesthetic complications: no   No notable events documented.  Last Vitals:  Vitals:   05/06/22 2200 05/06/22 2215  BP: 109/63 (!) 112/55  Pulse: 94 97  Resp: 16 15  Temp:    SpO2: 97% 96%    Last Pain:  Vitals:   05/06/22 2215  PainSc: 0-No pain                 Collene Schlichter

## 2022-05-07 NOTE — Transfer of Care (Signed)
Immediate Anesthesia Transfer of Care Note  Patient: Bryan Carlson  Procedure(s) Performed: PARS PLANA VITRECTOMY 25 GAUGE FOR ENDOPHTHALMITIS ,VITREOUS BIOPSY AND INJECTION OF ANTIBIOTICS (Left: Eye)  Patient Location: PACU  Anesthesia Type:MAC  Level of Consciousness: awake  Airway & Oxygen Therapy: Patient Spontanous Breathing and Patient connected to nasal cannula oxygen  Post-op Assessment: Report given to RN and Post -op Vital signs reviewed and stable  Post vital signs: Reviewed and stable  Last Vitals:  Vitals Value Taken Time  BP 112/55 05/06/22 2215  Temp 36.4 C 05/06/22 2151  Pulse 97 05/06/22 2215  Resp 15 05/06/22 2215  SpO2 96 % 05/06/22 2215    Last Pain:  Vitals:   05/06/22 2215  PainSc: 0-No pain         Complications: No notable events documented.

## 2022-05-08 LAB — ANAEROBIC CULTURE W GRAM STAIN

## 2022-05-09 LAB — ANAEROBIC CULTURE W GRAM STAIN

## 2022-05-11 LAB — ANAEROBIC CULTURE W GRAM STAIN

## 2022-05-13 DIAGNOSIS — K219 Gastro-esophageal reflux disease without esophagitis: Secondary | ICD-10-CM | POA: Insufficient documentation

## 2022-05-13 LAB — ANAEROBIC CULTURE W GRAM STAIN

## 2022-06-27 ENCOUNTER — Other Ambulatory Visit (HOSPITAL_COMMUNITY): Payer: Self-pay

## 2022-08-21 ENCOUNTER — Emergency Department (HOSPITAL_COMMUNITY)
Admission: EM | Admit: 2022-08-21 | Discharge: 2022-08-21 | Disposition: A | Discharge status: Home / Self Care | Payer: Medicare HMO | Source: Home / Self Care

## 2022-08-21 ENCOUNTER — Encounter (HOSPITAL_COMMUNITY): Payer: Self-pay

## 2022-08-21 ENCOUNTER — Emergency Department (HOSPITAL_COMMUNITY): Payer: Medicare HMO

## 2022-08-21 ENCOUNTER — Other Ambulatory Visit: Payer: Self-pay

## 2022-08-21 DIAGNOSIS — Z7984 Long term (current) use of oral hypoglycemic drugs: Secondary | ICD-10-CM | POA: Insufficient documentation

## 2022-08-21 DIAGNOSIS — Z87891 Personal history of nicotine dependence: Secondary | ICD-10-CM | POA: Diagnosis not present

## 2022-08-21 DIAGNOSIS — I471 Supraventricular tachycardia, unspecified: Secondary | ICD-10-CM | POA: Insufficient documentation

## 2022-08-21 DIAGNOSIS — Z794 Long term (current) use of insulin: Secondary | ICD-10-CM | POA: Insufficient documentation

## 2022-08-21 DIAGNOSIS — E876 Hypokalemia: Secondary | ICD-10-CM | POA: Diagnosis not present

## 2022-08-21 DIAGNOSIS — R079 Chest pain, unspecified: Secondary | ICD-10-CM | POA: Diagnosis present

## 2022-08-21 DIAGNOSIS — E119 Type 2 diabetes mellitus without complications: Secondary | ICD-10-CM | POA: Insufficient documentation

## 2022-08-21 LAB — CBC WITH DIFFERENTIAL/PLATELET
Abs Immature Granulocytes: 0.04 10*3/uL (ref 0.00–0.07)
Basophils Absolute: 0.1 10*3/uL (ref 0.0–0.1)
Basophils Relative: 1 %
Eosinophils Absolute: 0.2 10*3/uL (ref 0.0–0.5)
Eosinophils Relative: 2 %
HCT: 34.4 % — ABNORMAL LOW (ref 39.0–52.0)
Hemoglobin: 11.2 g/dL — ABNORMAL LOW (ref 13.0–17.0)
Immature Granulocytes: 0 %
Lymphocytes Relative: 33 %
Lymphs Abs: 3.8 10*3/uL (ref 0.7–4.0)
MCH: 29.7 pg (ref 26.0–34.0)
MCHC: 32.6 g/dL (ref 30.0–36.0)
MCV: 91.2 fL (ref 80.0–100.0)
Monocytes Absolute: 0.7 10*3/uL (ref 0.1–1.0)
Monocytes Relative: 7 %
Neutro Abs: 6.6 10*3/uL (ref 1.7–7.7)
Neutrophils Relative %: 57 %
Platelets: 352 10*3/uL (ref 150–400)
RBC: 3.77 MIL/uL — ABNORMAL LOW (ref 4.22–5.81)
RDW: 13.6 % (ref 11.5–15.5)
WBC: 11.4 10*3/uL — ABNORMAL HIGH (ref 4.0–10.5)
nRBC: 0 % (ref 0.0–0.2)

## 2022-08-21 LAB — D-DIMER, QUANTITATIVE: D-Dimer, Quant: 0.6 ug/mL-FEU — ABNORMAL HIGH (ref 0.00–0.50)

## 2022-08-21 LAB — COMPREHENSIVE METABOLIC PANEL
ALT: 11 U/L (ref 0–44)
AST: 13 U/L — ABNORMAL LOW (ref 15–41)
Albumin: 3.6 g/dL (ref 3.5–5.0)
Alkaline Phosphatase: 108 U/L (ref 38–126)
Anion gap: 11 (ref 5–15)
BUN: 9 mg/dL (ref 6–20)
CO2: 24 mmol/L (ref 22–32)
Calcium: 8.6 mg/dL — ABNORMAL LOW (ref 8.9–10.3)
Chloride: 105 mmol/L (ref 98–111)
Creatinine, Ser: 0.93 mg/dL (ref 0.61–1.24)
GFR, Estimated: >60 mL/min (ref >60)
Glucose, Bld: 128 mg/dL — ABNORMAL HIGH (ref 70–99)
Potassium: 2.9 mmol/L — ABNORMAL LOW (ref 3.5–5.1)
Sodium: 140 mmol/L (ref 135–145)
Total Bilirubin: 0.4 mg/dL (ref 0.3–1.2)
Total Protein: 7.8 g/dL (ref 6.5–8.1)

## 2022-08-21 LAB — LIPASE, BLOOD: Lipase: 29 U/L (ref 11–51)

## 2022-08-21 LAB — PHOSPHORUS: Phosphorus: 1.8 mg/dL — ABNORMAL LOW (ref 2.5–4.6)

## 2022-08-21 LAB — MAGNESIUM: Magnesium: 1.5 mg/dL — ABNORMAL LOW (ref 1.7–2.4)

## 2022-08-21 LAB — TROPONIN I (HIGH SENSITIVITY)
Troponin I (High Sensitivity): 9 ng/L (ref <18)
Troponin I (High Sensitivity): 27 ng/L — ABNORMAL HIGH (ref <18)

## 2022-08-21 MED ORDER — POTASSIUM CHLORIDE 10 MEQ/100ML IV SOLN
10.0000 meq | Freq: Once | INTRAVENOUS | Status: AC
  Administered 2022-08-21: 10 meq via INTRAVENOUS
  Filled 2022-08-21: qty 100

## 2022-08-21 MED ORDER — POTASSIUM CHLORIDE CRYS ER 20 MEQ PO TBCR: 20.0000 meq | EXTENDED_RELEASE_TABLET | Freq: Every day | ORAL | 0 refills | Status: AC

## 2022-08-21 MED ORDER — K PHOS MONO-SOD PHOS DI & MONO 155-852-130 MG PO TABS
500.0000 mg | ORAL_TABLET | Freq: Once | ORAL | Status: AC
  Administered 2022-08-21: 500 mg via ORAL
  Filled 2022-08-21: qty 2

## 2022-08-21 MED ORDER — MAGNESIUM SULFATE 2 GM/50ML IV SOLN
2.0000 g | Freq: Once | INTRAVENOUS | Status: AC
  Administered 2022-08-21: 2 g via INTRAVENOUS
  Filled 2022-08-21: qty 50

## 2022-08-21 NOTE — Discharge Instructions (Addendum)
You have SVT and your potassium level was low.  I have prescribed potassium tablets for you.  You need to repeat potassium level with your doctor in a week  I have also referred you to a cardiologist regarding SVT  Return to ER if you have chest pain or shortness of breath or palpitations.

## 2022-08-21 NOTE — ED Provider Notes (Signed)
[facility] AT [facility] Provider Note   CSN: [id number] Arrival date & time: 08/21/22  1312     History  Chief Complaint  Patient presents with   Chest Pain    Bryan Carlson is a 57 y.o. male.  Male presents emergency department with chest pain, lightheadedness/near syncope.  Symptom onset around 1230/1:00.  He has been having some intermittent lightheadedness/palpitations over the past several weeks.  Today's symptoms have lasted longer.  Reports no cardiac history.  He is a smoker, diabetic.  Does not have cardiologist.   Chest Pain      Home Medications Prior to Admission medications   Medication Sig Start Date End Date Taking? Authorizing Provider  potassium chloride SA (KLOR-CON M) 20 MEQ tablet Take 1 tablet (20 mEq total) by mouth daily. 08/21/22  Yes Charlynne Pander, MD  albuterol (VENTOLIN HFA) 108 (90 Base) MCG/ACT inhaler INHALE TWO PUFFS INTO THE LUNGS EVERY 4 HOURS AS NEEDED FOR WHEEZING OR SHORTNESS OF BREATH. 03/13/20   [provider]  atorvastatin (LIPITOR) 40 MG tablet Take 1 tablet (40 mg total) by mouth daily. 06/19/18   Rai, Ripudeep K, MD  cloNIDine (CATAPRES) 0.1 MG tablet Take 0.5 tablets (0.05 mg total) by mouth 2 (two) times daily. 06/19/18   Rai, Delene Ruffini, MD  cloZAPine (CLOZARIL) 100 MG tablet Take 400 mg by mouth at bedtime.    [provider]  diphenhydrAMINE HCl 50 MG/30ML LIQD Take 50 mg by mouth at bedtime.    [provider]  docusate sodium (COLACE) 100 MG capsule Take 1 capsule (100 mg total) by mouth 2 (two) times daily. For constipation 06/19/18   Rai, Ripudeep K, MD  fluticasone (FLONASE) 50 MCG/ACT nasal spray Place 1 spray into both nostrils daily.    [provider]  gabapentin (NEURONTIN) 800 MG tablet Take 1 tablet (800 mg total) by mouth 3 (three) times daily. 06/19/18   Rai, Delene Ruffini, MD  glipiZIDE (GLUCOTROL) 5 MG tablet Take 1 tablet (5 mg total) by mouth daily  before breakfast. Patient not taking: Reported on 03/14/2022 06/19/18   Rai, Delene Ruffini, MD  HYDROcodone-acetaminophen (NORCO) 7.5-325 MG tablet SMARTSIG:0.5-1 Tablet(s) By Mouth 1-3 Times Daily PRN    [provider]  insulin aspart (NOVOLOG FLEXPEN) 100 UNIT/ML FlexPen Sliding scale CBG 70 - 120: 0 units CBG 121 - 150: 1 unit,  CBG 151 - 200: 2 units,  CBG 201 - 250: 3 units,  CBG 251 - 300: 5 units,  CBG 301 - 350: 7 units,  CBG 351 - 400: 9 units   CBG > 400: 9 units and notify your MD Patient not taking: Reported on 03/14/2022 06/19/18   Rai, Ripudeep K, MD  insulin glargine (LANTUS) 100 unit/mL SOPN Inject 0.15 mLs (15 Units total) into the skin daily. 06/19/18   Rai, Ripudeep K, MD  Insulin Pen Needle 32G X 4 MM MISC Inject 15 Units into the skin daily. And novolog sliding scale 06/19/18   Rai, Ripudeep K, MD  levofloxacin (LEVAQUIN) 500 MG tablet Take 1 tablet (500 mg total) by mouth daily. 03/14/22   Olalere, Onnie Boer A, MD  lisinopril (ZESTRIL) 5 MG tablet Take 1 tablet (5 mg total) by mouth daily. Patient not taking: Reported on 03/14/2022 06/19/18   Rai, Delene Ruffini, MD  Melatonin 10 MG TABS Take 20 mg by mouth at bedtime. Patient not taking: Reported on 03/14/2022    [provider]  metFORMIN (GLUCOPHAGE) 1000 MG  tablet Take 1 tablet (1,000 mg total) by mouth 2 (two) times daily with a meal. 06/19/18   Rai, Ripudeep K, MD  Omega-3 Fatty Acids (FISH OIL PO) Take 1 capsule by mouth daily. Patient not taking: Reported on 03/14/2022    [provider]  omeprazole (PRILOSEC) 40 MG capsule Take 1 capsule (40 mg total) by mouth daily. 06/19/18   Rai, Ripudeep K, MD  ondansetron (ZOFRAN) 4 MG tablet Take 4 mg by mouth daily.    [provider]  pioglitazone (ACTOS) 30 MG tablet Take 30 mg by mouth daily.    [provider]  polyethylene glycol (MIRALAX) 17 g packet Take 17 g by mouth daily as needed for mild constipation or moderate constipation. 06/19/18   Rai,  Ripudeep K, MD  prazosin (MINIPRESS) 5 MG capsule Take 1 capsule (5 mg total) by mouth at bedtime. 06/19/18   Rai, Delene Ruffini, MD  predniSONE (DELTASONE) 20 MG tablet Take 1 tablet (20 mg total) by mouth daily with breakfast. 03/14/22   Olalere, Adewale A, MD  sertraline (ZOLOFT) 100 MG tablet Take 200 mg by mouth daily.    [provider]  sertraline (ZOLOFT) 50 MG tablet Take 50 mg by mouth daily. Patient not taking: Reported on 03/14/2022    [provider]  Tiotropium Bromide-Olodaterol (STIOLTO RESPIMAT) 2.5-2.5 MCG/ACT AERS Inhale 2 puffs into the lungs daily. 03/14/22   Tomma Lightning, MD  traZODone (DESYREL) 100 MG tablet Take 100 mg by mouth at bedtime. Patient not taking: Reported on 03/14/2022    [provider]      Allergies    Lisinopril and Contrast media [iodinated contrast media]    Review of Systems   Review of Systems  Cardiovascular:  Positive for chest pain.    Physical Exam Updated Vital Signs BP (!) 157/82   Pulse 88   Temp 98.4 F (36.9 C) (Oral)   Resp 19   Ht 5\' 11"  (1.803 m)   Wt 96 kg   SpO2 98%   BMI 29.52 kg/m  Physical Exam Vitals and nursing note reviewed.  Constitutional:      General: He is in acute distress.     Appearance: He is obese.  Cardiovascular:     Rate and Rhythm: Regular rhythm. Tachycardia present.     Pulses:          Radial pulses are 2+ on the right side and 2+ on the left side.     Heart sounds: Normal heart sounds.  Pulmonary:     Effort: Pulmonary effort is normal.     Breath sounds: Normal breath sounds.  Musculoskeletal:     Right lower leg: No edema.     Left lower leg: No edema.  Skin:    General: Skin is warm.     Capillary Refill: Capillary refill takes less than 2 seconds.  Neurological:     Mental Status: He is alert and oriented to person, place, and time.  Psychiatric:        Mood and Affect: Mood normal.        Behavior: Behavior normal.     ED Results / Procedures /  Treatments   Labs (all labs ordered are listed, but only abnormal results are displayed) Labs Reviewed  COMPREHENSIVE METABOLIC PANEL - Abnormal; Notable for the following components:      Result Value   Potassium 2.9 (*)    Glucose, Bld 128 (*)    Calcium 8.6 (*)  AST 13 (*)    All other components within normal limits  CBC WITH DIFFERENTIAL/PLATELET - Abnormal; Notable for the following components:   WBC 11.4 (*)    RBC 3.77 (*)    Hemoglobin 11.2 (*)    HCT 34.4 (*)    All other components within normal limits  MAGNESIUM - Abnormal; Notable for the following components:   Magnesium 1.5 (*)    All other components within normal limits  PHOSPHORUS - Abnormal; Notable for the following components:   Phosphorus 1.8 (*)    All other components within normal limits  D-DIMER, QUANTITATIVE - Abnormal; Notable for the following components:   D-Dimer, Quant 0.60 (*)    All other components within normal limits  TROPONIN I (HIGH SENSITIVITY) - Abnormal; Notable for the following components:   Troponin I (High Sensitivity) 27 (*)    All other components within normal limits  LIPASE, BLOOD  TROPONIN I (HIGH SENSITIVITY)    EKG EKG Interpretation Date/Time:  Thursday August 21 2022 13:23:21 EDT Ventricular Rate:  181 PR Interval:  113 QRS Duration:  94 QT Interval:  304 QTC Calculation: 528 R Axis:   70  Text Interpretation: Supraventricular tachycardia RSR' in V1 or V2, right VCD or RVH ST depression, probably rate related Artifact in lead(s) II aVL SVT new since previous Confirmed by Richardean Canal [phone]) on 08/21/2022 4:55:52 PM  Radiology No results found.  Procedures Procedures    Medications Ordered in ED Medications  magnesium sulfate IVPB 2 g 50 mL (0 g Intravenous Stopped 08/21/22 1612)  phosphorus (K PHOS NEUTRAL) tablet 500 mg (500 mg Oral Given 08/21/22 1511)  potassium chloride 10 mEq in 100 mL IVPB (0 mEq Intravenous Stopped 08/21/22 1814)    ED Course/  Medical Decision Making/ A&P Clinical Course as of 08/26/22 0705  Thu Aug 21, 2022  1341 Patient evaluated bedside was in SVT heart rate 180s, blood pressure slightly soft.  Patient somewhat diaphoretic.  Moved to recess bay, placed on pads.  Valsalva maneuver with conversion to sinus tach at a heart rate of 101. [TY]  1400 CBC with Differential(!) Mild leukocytosis and anemia.  [TY]  1412 D-dimer, quantitative(!) Age adjusted D-Dimer 570 which makes makes VTE unlikely. No SOB, no hypoxia.  [TY]  1646 Patient reevaluated, feeling much improved.  Essentially asymptomatic.  Reports heart rate chronically elevated in the high 90s low 100s.  It appears he has some electrolyte derangements which were repleted.  He has remained in normal sinus rhythm since initial conversion with vagal maneuver. [TY]    Clinical Course User Index [TY] Coral Spikes, DO                             Medical Decision Making 57 year old male presenting emergency department for lightheadedness, chest pain.  He is initially in SVT with heart rate in the 180s.  Converted with Valsalva maneuver.  EKG without ST segment changes to indicate ischemia.  Reports no history of SVT or palpitations.  Reports his lightheadedness improved.  Remains in sinus tachycardia.  Will get labs to evaluate for underlying cause.  See ED course for further MDM disposition.  Amount and/or Complexity of Data Reviewed Independent Historian: spouse    Details: Reports symptoms started at 1230. External Data Reviewed: notes.    Details: Not appear to have cardiac workup on file.  Unable to locate echo. Labs: ordered. Decision-making details documented in ED Course.  Radiology: ordered. Decision-making details documented in ED Course. ECG/medicine tests:  Decision-making details documented in ED Course.  Risk Prescription drug management. Decision regarding hospitalization.        Final Clinical Impression(s) / ED Diagnoses Final  diagnoses:  SVT (supraventricular tachycardia)  Hypokalemia  Hypomagnesemia    Rx / DC Orders ED Discharge Orders          Ordered    Ambulatory referral to Cardiology        08/21/22 1921    potassium chloride SA (KLOR-CON M) 20 MEQ tablet  Daily        08/21/22 1922              Coral Spikes, [place] 08/26/22 [phone]

## 2022-08-21 NOTE — ED Triage Notes (Signed)
Patient presented to ER with chest pain. EKG done HR 181 showing SVT. Patient endorses chest pain radiating to left arm, endorses some neck pain. Pt A&Ox4, states he took 6 baby aspirin at home prior to coming, started about 30 minutes ago.

## 2022-08-21 NOTE — ED Provider Notes (Signed)
  Physical Exam  BP (!) 174/92   Pulse 91   Temp 98.4 F (36.9 C) (Oral)   Resp 17   Ht 5\' 11"  (1.803 m)   Wt 96 kg   SpO2 97%   BMI 29.52 kg/m   Physical Exam  Procedures  Procedures  ED Course / MDM   Clinical Course as of 08/21/22 1918  Thu Aug 21, 2022  1341 Patient evaluated bedside was in SVT heart rate 180s, blood pressure slightly soft.  Patient somewhat diaphoretic.  Moved to recess bay, placed on pads.  Valsalva maneuver with conversion to sinus tach at a heart rate of 101. [TY]  1400 CBC with Differential(!) Mild leukocytosis and anemia.  [TY]  1412 D-dimer, quantitative(!) Age adjusted D-Dimer 570 which makes makes VTE unlikely. No SOB, no hypoxia.  [TY]  1646 Patient reevaluated, feeling much improved.  Essentially asymptomatic.  Reports heart rate chronically elevated in the high 90s low 100s.  It appears he has some electrolyte derangements which were repleted.  He has remained in normal sinus rhythm since initial conversion with vagal maneuver. [TY]    Clinical Course User Index [TY] Coral Spikes, DO   Medical Decision Making Care assumed at 5 PM.  Patient is here with palpitations.  Patient was found to be hypokalemic with potassium of 2.9.  Patient also has slightly low magnesium as well.  Potassium magnesium were getting replaced.  Patient had D-dimer of 0.6 but normal if you age-adjusted.  Signout pending reassessment and second troponin  7:19 PM Second troponin is 27.  I think this is likely from SVT.  Patient has no chest pain and remains in sinus rhythm in the ED.  At this point, patient is stable for discharge.  Will refer to cardiology outpatient.  Will also prescribe potassium tablets and recommend repeat potassium level with PCP in a week   Problems Addressed: Hypokalemia: acute illness or injury Hypomagnesemia: acute illness or injury SVT (supraventricular tachycardia): acute illness or injury  Amount and/or Complexity of Data Reviewed Labs:  ordered. Decision-making details documented in ED Course. Radiology: ordered and independent interpretation performed. Decision-making details documented in ED Course. ECG/medicine tests: ordered and independent interpretation performed. Decision-making details documented in ED Course.  Risk Prescription drug management.          Charlynne Pander, MD 08/21/22 [phone]

## 2022-09-01 NOTE — Progress Notes (Addendum)
Lenon Curt MD Reason for referral-SVT  HPI: 57 year old male for evaluation of SVT at request of Lawana Pai, MD.  Patient seen in the emergency room August 21, 2022 with chest pain and near syncope and found to have SVT.  Troponin I 9 and 27.  D-dimer 0.60.  Hemoglobin 11.2 with MCV 91.2.  Potassium 2.9, creatinine 0.93.  Patient converted to sinus rhythm with Valsalva.  Follow-up ECG showed sinus rhythm with RV conduction delay.  Cardiology now asked to evaluate.  Current Outpatient Medications  Medication Sig Dispense Refill   albuterol (VENTOLIN HFA) 108 (90 Base) MCG/ACT inhaler INHALE TWO PUFFS INTO THE LUNGS EVERY 4 HOURS AS NEEDED FOR WHEEZING OR SHORTNESS OF BREATH.     atorvastatin (LIPITOR) 40 MG tablet Take 1 tablet (40 mg total) by mouth daily. 90 tablet 2   cloNIDine (CATAPRES) 0.1 MG tablet Take 0.5 tablets (0.05 mg total) by mouth 2 (two) times daily. 60 tablet 11   cloZAPine (CLOZARIL) 100 MG tablet Take 400 mg by mouth at bedtime.     diphenhydrAMINE HCl 50 MG/30ML LIQD Take 50 mg by mouth at bedtime.     docusate sodium (COLACE) 100 MG capsule Take 1 capsule (100 mg total) by mouth 2 (two) times daily. For constipation 60 capsule 3   fluticasone (FLONASE) 50 MCG/ACT nasal spray Place 1 spray into both nostrils daily.     gabapentin (NEURONTIN) 800 MG tablet Take 1 tablet (800 mg total) by mouth 3 (three) times daily. 90 tablet 1   glipiZIDE (GLUCOTROL) 5 MG tablet Take 1 tablet (5 mg total) by mouth daily before breakfast. (Patient not taking: Reported on 03/14/2022) 90 tablet 2   HYDROcodone-acetaminophen (NORCO) 7.5-325 MG tablet SMARTSIG:0.5-1 Tablet(s) By Mouth 1-3 Times Daily PRN     insulin aspart (NOVOLOG FLEXPEN) 100 UNIT/ML FlexPen Sliding scale CBG 70 - 120: 0 units CBG 121 - 150: 1 unit,  CBG 151 - 200: 2 units,  CBG 201 - 250: 3 units,  CBG 251 - 300: 5 units,  CBG 301 - 350: 7 units,  CBG 351 - 400: 9 units   CBG > 400: 9 units and notify your MD  (Patient not taking: Reported on 03/14/2022) 15 mL 11   insulin glargine (LANTUS) 100 unit/mL SOPN Inject 0.15 mLs (15 Units total) into the skin daily. 15 mL 11   Insulin Pen Needle 32G X 4 MM MISC Inject 15 Units into the skin daily. And novolog sliding scale 100 each 3   levofloxacin (LEVAQUIN) 500 MG tablet Take 1 tablet (500 mg total) by mouth daily. 7 tablet 0   lisinopril (ZESTRIL) 5 MG tablet Take 1 tablet (5 mg total) by mouth daily. (Patient not taking: Reported on 03/14/2022) 90 tablet 2   Melatonin 10 MG TABS Take 20 mg by mouth at bedtime. (Patient not taking: Reported on 03/14/2022)     metFORMIN (GLUCOPHAGE) 1000 MG tablet Take 1 tablet (1,000 mg total) by mouth 2 (two) times daily with a meal. 60 tablet 5   Omega-3 Fatty Acids (FISH OIL PO) Take 1 capsule by mouth daily. (Patient not taking: Reported on 03/14/2022)     omeprazole (PRILOSEC) 40 MG capsule Take 1 capsule (40 mg total) by mouth daily. 30 capsule 3   ondansetron (ZOFRAN) 4 MG tablet Take 4 mg by mouth daily.     pioglitazone (ACTOS) 30 MG tablet Take 30 mg by mouth daily.     polyethylene glycol (MIRALAX) 17 g packet  Take 17 g by mouth daily as needed for mild constipation or moderate constipation. 30 each 0   potassium chloride SA (KLOR-CON M) 20 MEQ tablet Take 1 tablet (20 mEq total) by mouth daily. 5 tablet 0   prazosin (MINIPRESS) 5 MG capsule Take 1 capsule (5 mg total) by mouth at bedtime. 30 capsule 3   predniSONE (DELTASONE) 20 MG tablet Take 1 tablet (20 mg total) by mouth daily with breakfast. 7 tablet 0   sertraline (ZOLOFT) 100 MG tablet Take 200 mg by mouth daily.     sertraline (ZOLOFT) 50 MG tablet Take 50 mg by mouth daily. (Patient not taking: Reported on 03/14/2022)     Tiotropium Bromide-Olodaterol (STIOLTO RESPIMAT) 2.5-2.5 MCG/ACT AERS Inhale 2 puffs into the lungs daily. 4 g 3   traZODone (DESYREL) 100 MG tablet Take 100 mg by mouth at bedtime. (Patient not taking: Reported on 03/14/2022)     No  current facility-administered medications for this visit.    Allergies  Allergen Reactions   Lisinopril Swelling   Contrast Media [Iodinated Contrast Media] Hives     Past Medical History:  Diagnosis Date   Cholecystitis    Chronic pain    Depression    Diabetes mellitus without complication (HCC)    Insomnia    Memory loss    PTSD (post-traumatic stress disorder)    Renal disorder    Schizophrenia (HCC)     Past Surgical History:  Procedure Laterality Date   CERVICAL FUSION     LUMBAR FUSION     PARS PLANA VITRECTOMY Left 05/06/2022   Procedure: PARS PLANA VITRECTOMY 25 GAUGE FOR ENDOPHTHALMITIS ,VITREOUS BIOPSY AND INJECTION OF ANTIBIOTICS;  Surgeon: Carmela Rima, MD;  Location: [facility] OR;  Service: Ophthalmology;  Laterality: Left;   SPINAL CORD STIMULATOR IMPLANT      Social History   Socioeconomic History   Marital status: Married    Spouse name: Not on file   Number of children: Not on file   Years of education: Not on file   Highest education level: Not on file  Occupational History   Not on file  Tobacco Use   Smoking status: Every Day    Current packs/day: 1.50    Types: Cigarettes   Smokeless tobacco: Never  Vaping Use   Vaping status: Never Used  Substance and Sexual Activity   Alcohol use: Not Currently   Drug use: Not Currently    Types: Cocaine   Sexual activity: Not Currently  Other Topics Concern   Not on file  Social History Narrative   Not on file   Social Determinants of Health   Financial Resource Strain: Medium Risk (08/09/2022)   Received from [facility]   Overall Financial Resource Strain (CARDIA)    Difficulty of Paying Living Expenses: Somewhat hard  Food Insecurity: No Food Insecurity (08/09/2022)   Received from [facility]   Hunger Vital Sign    Worried About Running Out of Food in the Last Year: Never true    Ran Out of Food in the Last Year: Never true  Transportation Needs: No Transportation Needs (08/09/2022)    Received from [facility] - Transportation    Lack of Transportation (Medical): No    Lack of Transportation (Non-Medical): No  Physical Activity: Unknown (08/09/2022)   Received from [facility]   Exercise Vital Sign    Days of Exercise per Week: 0 days    Minutes of Exercise per Session: Not on file  Stress: Stress Concern Present (08/09/2022)   Received from [facility] - Occupational Stress Questionnaire    Feeling of Stress : Very much  Social Connections: Socially Isolated (08/09/2022)   Received from [facility]   Social Network    How would you rate your social network (family, work, friends)?: Little participation, lonely and socially isolated  Intimate Partner Violence: Not At Risk (08/09/2022)   Received from [facility]   HITS    Over the last 12 months how often did your partner physically hurt you?: 1    Over the last 12 months how often did your partner insult you or talk down to you?: 1    Over the last 12 months how often did your partner threaten you with physical harm?: 1    Over the last 12 months how often did your partner scream or curse at you?: 1    Family History  Problem Relation Age of Onset   Hypertension Mother    Diabetes Father     ROS: no fevers or chills, productive cough, hemoptysis, dysphasia, odynophagia, melena, hematochezia, dysuria, hematuria, rash, seizure activity, orthopnea, PND, pedal edema, claudication. Remaining systems are negative.  Physical Exam:   There were no vitals taken for this visit.  General:  Well developed/well nourished in NAD Skin warm/dry Patient not depressed No peripheral clubbing Back-normal HEENT-normal/normal eyelids Neck supple/normal carotid upstroke bilaterally; no bruits; no JVD; no thyromegaly chest - CTA/ normal expansion CV - RRR/normal S1 and S2; no murmurs, rubs or gallops;  PMI nondisplaced Abdomen -NT/ND, no HSM, no mass, + bowel  sounds, no bruit 2+ femoral pulses, no bruits Ext-no edema, chords, 2+ DP Neuro-grossly nonfocal  ECG - personally reviewed  A/P  1 SVT-  2 minimally elevated troponin-  Olga Millers, MD

## 2022-09-02 ENCOUNTER — Ambulatory Visit: Payer: Medicare HMO | Attending: Cardiology | Admitting: Cardiology

## 2022-09-02 ENCOUNTER — Encounter: Payer: Self-pay | Admitting: Cardiology

## 2022-09-02 VITALS — BP 120/72 | HR 72 | Ht 68.0 in | Wt 217.6 lb

## 2022-09-02 DIAGNOSIS — I471 Supraventricular tachycardia, unspecified: Secondary | ICD-10-CM

## 2022-09-02 DIAGNOSIS — R072 Precordial pain: Secondary | ICD-10-CM | POA: Diagnosis not present

## 2022-09-02 DIAGNOSIS — Z720 Tobacco use: Secondary | ICD-10-CM

## 2022-09-02 DIAGNOSIS — I10 Essential (primary) hypertension: Secondary | ICD-10-CM | POA: Diagnosis not present

## 2022-09-02 MED ORDER — ASPIRIN 81 MG PO TBEC
81.0000 mg | DELAYED_RELEASE_TABLET | Freq: Every day | ORAL | Status: AC
Start: 2022-09-02 — End: ?

## 2022-09-02 MED ORDER — METOPROLOL SUCCINATE ER 25 MG PO TB24
25.0000 mg | ORAL_TABLET | Freq: Every day | ORAL | 3 refills | Status: DC
Start: 2022-09-02 — End: 2022-11-03

## 2022-09-02 MED ORDER — METOPROLOL TARTRATE 100 MG PO TABS: ORAL_TABLET | ORAL | 0 refills | Status: DC

## 2022-09-02 MED ORDER — PREDNISONE 50 MG PO TABS: ORAL_TABLET | ORAL | 0 refills | Status: DC

## 2022-09-02 NOTE — Patient Instructions (Signed)
Medication Instructions:   STOP CHLORTHALIDONE   START METOPROLOL SUCC ER 25 MG ONCE DAILY AT BEDTIME  START ASPIRIN 81 MG ONCE DAILY  *If you need a refill on your cardiac medications before your next appointment, please call your pharmacy*   Testing/Procedures:  Your physician has requested that you have an echocardiogram. Echocardiography is a painless test that uses sound waves to create images of your heart. It provides your doctor with information about the size and shape of your heart and how well your heart's chambers and valves are working. This procedure takes approximately one hour. There are no restrictions for this procedure. Please do NOT wear cologne, perfume, aftershave, or lotions (deodorant is allowed). Please arrive 15 minutes prior to your appointment time. [address]   Your cardiac CT will be scheduled at   [facility] [address] [place], [place] [address] [phone]   If scheduled at [facility], please arrive at the [facility] and Children's Entrance (Entrance C2) of [facility] 30 minutes prior to test start time. You can use the FREE valet parking offered at entrance C (encouraged to control the heart rate for the test)  Proceed to the [facility] Radiology Department (first floor) to check-in and test prep.  All radiology patients and guests should use entrance C2 at [facility], accessed from [facility], [place], even though the hospital's physical address listed is [address]      Please follow these instructions carefully (unless otherwise directed):  An IV will be required for this test and Nitroglycerin will be given.  Hold all erectile dysfunction medications at least 3 days (72 hrs) prior to test. (Ie viagra, cialis, sildenafil, tadalafil, etc)   On the Night Before the Test: Be sure to Drink plenty of water. Do not consume any caffeinated/decaffeinated beverages or  chocolate 12 hours prior to your test. Do not take any antihistamines 12 hours prior to your test. TAKE PREDNISONE 50 MG 13 HOURS PRIOR TO CT, 7 HOURS PRIOR TO CT AND 1 HOUR PRIOR TO CT TAKE BENADRYL 50 MG 1 HOUR PRIOR TO CT   On the Day of the Test: Drink plenty of water until 1 hour prior to the test. Do not eat any food 1 hour prior to test. You may take your regular medications prior to the test.  Take metoprolol (Lopressor) 100 MG two hours prior to test. DO NOT TAKE FUROSEMIDE THE DAY OF THE CT SCAN      After the Test: Drink plenty of water. After receiving IV contrast, you may experience a mild flushed feeling. This is normal. On occasion, you may experience a mild rash up to 24 hours after the test. This is not dangerous. If this occurs, you can take Benadryl 25 mg and increase your fluid intake. If you experience trouble breathing, this can be serious. If it is severe call 911 IMMEDIATELY. If it is mild, please call our office. DO NOT TAKE GLIPIZIDE THE MORNING OF THE CT AND 2 DAYS AFTER THE CT-RESTART THE 3RD DAY AFTER CT SCAN  We will call to schedule your test 2-4 weeks out understanding that some insurance companies will need an authorization prior to the service being performed.   For more information and frequently asked questions, please visit our website : [email or web address]  For non-scheduling related questions, please contact the cardiac imaging nurse navigator should you have any questions/concerns: Cardiac Imaging Nurse Navigators Direct Office Dial: [phone]  For scheduling needs, including cancellations and rescheduling, please call [place], [phone].    Follow-Up: At [facility], you and your health needs are our priority.  As part of our continuing mission to provide you with exceptional heart care, we have created designated Provider Care Teams.  These Care Teams include your primary Cardiologist (physician) and Advanced  Practice Providers (APPs -  Physician Assistants and Nurse Practitioners) who all work together to provide you with the care you need, when you need it.  We recommend signing up for the patient portal called "MyChart".  Sign up information is provided on this After Visit Summary.  MyChart is used to connect with patients for Virtual Visits (Telemedicine).  Patients are able to view lab/test results, encounter notes, upcoming appointments, etc.  Non-urgent messages can be sent to your provider as well.   To learn more about what you can do with MyChart, go to [email or web address].    Your next appointment:   3 month(s)  Provider:   Olga Millers MD

## 2022-09-03 ENCOUNTER — Encounter: Payer: Self-pay | Admitting: *Deleted

## 2022-09-13 ENCOUNTER — Other Ambulatory Visit: Payer: Self-pay | Admitting: Cardiology

## 2022-09-13 DIAGNOSIS — R600 Localized edema: Secondary | ICD-10-CM

## 2022-09-22 ENCOUNTER — Other Ambulatory Visit: Payer: Self-pay

## 2022-09-22 ENCOUNTER — Telehealth: Payer: Self-pay | Admitting: *Deleted

## 2022-09-22 ENCOUNTER — Ambulatory Visit (HOSPITAL_COMMUNITY): Payer: Medicare HMO | Attending: Cardiology

## 2022-09-22 DIAGNOSIS — I471 Supraventricular tachycardia, unspecified: Secondary | ICD-10-CM | POA: Insufficient documentation

## 2022-09-22 DIAGNOSIS — R072 Precordial pain: Secondary | ICD-10-CM | POA: Insufficient documentation

## 2022-09-22 LAB — ECHOCARDIOGRAM COMPLETE
Area-P 1/2: 5.42 cm2
S' Lateral: 3.3 cm

## 2022-09-22 MED ORDER — METOPROLOL TARTRATE 100 MG PO TABS: ORAL_TABLET | ORAL | 0 refills | Status: AC

## 2022-09-22 MED ORDER — PREDNISONE 50 MG PO TABS: ORAL_TABLET | ORAL | 0 refills | Status: AC

## 2022-09-22 NOTE — Telephone Encounter (Signed)
Patient and wife here today for his echo.  Echo tech came to me as the patient thought he was having his CT done today as well and took all the pre-meds for his CT.  I pulled the AVS and looks as if it has not been scheduled yet.  When I went to talk with the patient and his wife I reprinted the AVS from his 8/6 OV with Dr. Jens Som and went over this with both wife and patient.  I let them know they would receive a call regarding the CT once this has been approved with his insurance and the test would be done at [facility] as highlighted in the AVS.  We have called in the pre-meds again to [facility] on [address] to take once CT is scheduled and gone over the instructions for taking.  He never picked up the Metoprolol Succ 25 mg to be taken daily at bedtime as instructed.  Asked the wife to ensure they picked this up and to begin taking tomorrow (8/27) night.  They are both aware they will be called to schedule the CT and voice understanding of how to take pre-meds.

## 2022-10-03 ENCOUNTER — Encounter (HOSPITAL_COMMUNITY): Payer: Self-pay

## 2022-11-03 ENCOUNTER — Encounter: Payer: Self-pay | Admitting: Cardiology

## 2022-11-03 ENCOUNTER — Ambulatory Visit: Payer: Medicare HMO | Attending: Cardiology | Admitting: Cardiology

## 2022-11-03 VITALS — BP 140/82 | HR 67 | Ht 68.0 in | Wt 227.0 lb

## 2022-11-03 DIAGNOSIS — I10 Essential (primary) hypertension: Secondary | ICD-10-CM | POA: Diagnosis not present

## 2022-11-03 DIAGNOSIS — I471 Supraventricular tachycardia, unspecified: Secondary | ICD-10-CM | POA: Diagnosis not present

## 2022-11-03 MED ORDER — METOPROLOL SUCCINATE ER 50 MG PO TB24: 50.0000 mg | ORAL_TABLET | Freq: Every day | ORAL | 6 refills | Status: DC

## 2022-11-03 NOTE — Patient Instructions (Signed)
Medication Instructions:  Your physician has recommended you make the following change in your medication:  START Metoprolol Succinate 50 mg once daily at bedtime  *If you need a refill on your cardiac medications before your next appointment, please call your pharmacy*   Lab Work: None ordered   Testing/Procedures: None ordered   Follow-Up: At [facility], you and your health needs are our priority.  As part of our continuing mission to provide you with exceptional heart care, we have created designated Provider Care Teams.  These Care Teams include your primary Cardiologist (physician) and Advanced Practice Providers (APPs -  Physician Assistants and Nurse Practitioners) who all work together to provide you with the care you need, when you need it.  We recommend signing up for the patient portal called "MyChart".  Sign up information is provided on this After Visit Summary.  MyChart is used to connect with patients for Virtual Visits (Telemedicine).  Patients are able to view lab/test results, encounter notes, upcoming appointments, etc.  Non-urgent messages can be sent to your provider as well.   To learn more about what you can do with MyChart, go to [email or web address].    Your next appointment:   6 month(s)  The format for your next appointment:   In Person  Provider:   Loman Brooklyn, MD    Thank you for choosing [facility]!!   Dory Horn, RN [phone]

## 2022-11-03 NOTE — Progress Notes (Signed)
Electrophysiology Office Note:   Date:  11/03/2022  ID:  Bryan Carlson, DOB 1965/03/21, MRN [id number]  Primary Cardiologist: Olga Millers, MD Electrophysiologist: Regan Lemming, MD      History of Present Illness:   Bryan Carlson is a 58 y.o. male with h/o COPD, depression, diabetes, hypertension, hyperlipidemia, SVT seen today for  for Electrophysiology evaluation of SVT at the request of Olga Millers.    He was seen in the emergency room 08/21/2018 for chest pain and near syncope.  He was found to have SVT.  He had potassium of 2.9 and normal creatinine at the time.  He converted to sinus rhythm with Valsalva.  He has had intermittent episodes of SVT for many years.  Over the past 2 years, he has had 4-5 separate episodes with the July episode being the worst.  He has heart racing and developed dizziness, chest pain, dyspnea.  Discussed the use of AI scribe software for clinical note transcription with the patient, who gave verbal consent to proceed.  History of Present Illness   The patient, with a history of tachycardia, presents with recurrent episodes of rapid heart rate. He reports that these episodes have occurred twice since his last visit in July, and have been resolved each time with vagal maneuvers. The patient denies any specific triggers for these episodes, stating he can occur regardless of activity level or body position. He expresses a desire for treatment, citing a family history of heart disease (father had triple bypass surgery) as a concern.  The patient was prescribed metoprolol for this condition, but reports he has not been taking it due to a miscommunication about the prescription pickup. He also reports a history of chest tightness associated with the tachycardia episodes.      Review of systems complete and found to be negative unless listed in HPI.   EP Information / Studies Reviewed:    EKG is ordered today. Personal review as below.  EKG  Interpretation Date/Time:  Monday November 03 2022 14:24:10 EDT Ventricular Rate:  76 PR Interval:  200 QRS Duration:  100 QT Interval:  400 QTC Calculation: 450 R Axis:   43  Text Interpretation: Normal sinus rhythm Possible Left atrial enlargement Incomplete right bundle branch block When compared with ECG of 21-Aug-2022 13:23, Sinus rhythm Confirmed by Freya Zobrist ([id number]) on 11/03/2022 2:39:43 PM     Risk Assessment/Calculations:             Physical Exam:   VS:  BP (!) 140/82 (BP Location: Left Arm, Patient Position: Sitting, Cuff Size: Normal)   Pulse 67   Ht 5\' 8"  (1.727 m)   Wt 227 lb (103 kg)   BMI 34.52 kg/m    Wt Readings from Last 3 Encounters:  11/03/22 227 lb (103 kg)  09/02/22 217 lb 9.6 oz (98.7 kg)  08/21/22 211 lb 10.3 oz (96 kg)     GEN: Well nourished, well developed in no acute distress NECK: No JVD; No carotid bruits CARDIAC: Regular rate and rhythm, no murmurs, rubs, gallops RESPIRATORY:  Clear to auscultation without rales, wheezing or rhonchi  ABDOMEN: Soft, non-tender, non-distended EXTREMITIES:  No edema; No deformity   ASSESSMENT AND PLAN:    1.  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  Did terminate with vagal maneuvers.  He was initially prescribed metoprolol but has not taken it.  I offered him ablation versus medical management with metoprolol.  He would prefer to try medical management first.  Bryan Carlson  start Toprol-XL 50 mg.  2.  Minimally elevated troponin: Likely due to SVT.  Has coronary CTA planned by primary cardiology.  3.  Hypertension: Mildly elevated.  Plan to start metoprolol.  Follow up with Dr. Elberta Fortis in 6 months  Signed, Noya Santarelli Jorja Loa, MD

## 2023-05-06 ENCOUNTER — Encounter: Payer: Self-pay | Admitting: Emergency Medicine

## 2023-05-06 ENCOUNTER — Ambulatory Visit: Admission: EM | Admit: 2023-05-06 | Discharge: 2023-05-06 | Disposition: A | Discharge status: Home / Self Care

## 2023-05-06 DIAGNOSIS — K047 Periapical abscess without sinus: Secondary | ICD-10-CM | POA: Diagnosis not present

## 2023-05-06 DIAGNOSIS — K635 Polyp of colon: Secondary | ICD-10-CM | POA: Insufficient documentation

## 2023-05-06 MED ORDER — PENICILLIN V POTASSIUM 500 MG PO TABS: 500.0000 mg | ORAL_TABLET | Freq: Four times a day (QID) | ORAL | 0 refills | Status: AC

## 2023-05-06 NOTE — ED Provider Notes (Signed)
 [facility]    CSN: [id number] Arrival date & time: 05/06/23  1148      History   Chief Complaint Chief Complaint  Patient presents with   Dental Pain   Abscess    HPI Bryan Carlson is a 58 y.o. male who presents with pain of L upper front tooth area which is chipped since last night. Has not had a fever. Does not have a dentist. And does not know if he has dental coverage. Has had this area get infected in the past. Has been taking Ibuprofen and Tylenol for pain.     Past Medical History:  Diagnosis Date   Cholecystitis    Chronic pain    COPD (chronic obstructive pulmonary disease) (HCC)    Depression    Diabetes mellitus without complication (HCC)    Hyperlipidemia    Hypertension    Insomnia    Memory loss    PTSD (post-traumatic stress disorder)    Renal disorder    Schizophrenia [facility])     Patient Active Problem List   Diagnosis Date Noted   Colon polyp 05/06/2023   Gastroesophageal reflux disease without esophagitis 05/13/2022   Ulnar neuropathy of left upper extremity 03/26/2022   Trochanteric bursitis of right hip 03/14/2022   Carpal tunnel syndrome of right wrist 01/13/2022   Cubital tunnel syndrome on left 12/30/2021   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/2021   History of fusion of cervical spine 06/10/2021   History of lumbar fusion 06/10/2021   Lumbar radiculopathy 06/10/2021   Neck pain 06/10/2021   Panlobular emphysema (HCC) 09/26/2020   Chronic midline low back pain 10/07/2018   Diabetic polyneuropathy associated with type 2 diabetes mellitus (HCC) 10/07/2018   Severe episode of recurrent major depressive disorder, with psychotic features (HCC) 10/07/2018   Hyperlipidemia associated with type 2 diabetes mellitus (HCC) 07/06/2018   Nocturia 07/06/2018   Type 2 diabetes mellitus, with long-term current use of insulin (HCC) 07/06/2018   Tobacco abuse 06/19/2018   DKA (diabetic ketoacidosis) (HCC) 06/18/2018   Abdominal pain 06/18/2018    Hypertension associated with diabetes (HCC) 06/18/2018   Memory difficulties 06/18/2018    Past Surgical History:  Procedure Laterality Date   CERVICAL FUSION     LUMBAR FUSION     PARS PLANA VITRECTOMY Left 05/06/2022   Procedure: PARS PLANA VITRECTOMY 25 GAUGE FOR ENDOPHTHALMITIS ,VITREOUS BIOPSY AND INJECTION OF ANTIBIOTICS;  Surgeon: Carmela Rima, MD;  Location: [facility] OR;  Service: Ophthalmology;  Laterality: Left;   SPINAL CORD STIMULATOR IMPLANT         Home Medications    Prior to Admission medications   Medication Sig Start Date End Date Taking? Authorizing Provider  Alcohol Swabs (B-D SINGLE USE SWABS REGULAR) PADS See admin instructions. 02/09/23  Yes [provider]  gabapentin (NEURONTIN) 300 MG capsule Take by mouth. 03/23/23 06/20/25 Yes [provider]  insulin glargine (LANTUS SOLOSTAR) 100 UNIT/ML Solostar Pen Inject into the skin. 02/09/23  Yes [provider]  penicillin v potassium (VEETID) 500 MG tablet Take 1 tablet (500 mg total) by mouth 4 (four) times daily for 7 days. 05/06/23 05/13/23 Yes Rodriguez-Southworth, Nettie Elm, PA-C  acetaminophen (TYLENOL) 325 MG tablet Take 650 mg by mouth every 6 (six) hours as needed for moderate pain or mild pain.    [provider]  albuterol (VENTOLIN HFA) 108 (90 Base) MCG/ACT inhaler INHALE TWO PUFFS INTO THE LUNGS EVERY 4 HOURS AS NEEDED FOR WHEEZING OR SHORTNESS OF BREATH. 03/13/20  [provider]  aspirin EC 81 MG tablet Take 1 tablet (81 mg total) by mouth daily. Swallow whole. 09/02/22   Lewayne Bunting, MD  atorvastatin (LIPITOR) 40 MG tablet Take 1 tablet (40 mg total) by mouth daily. 06/19/18   Rai, Ripudeep K, MD  cloNIDine (CATAPRES) 0.1 MG tablet Take 0.5 tablets (0.05 mg total) by mouth 2 (two) times daily. 06/19/18   Rai, Delene Ruffini, MD  cloZAPine (CLOZARIL) 100 MG tablet Take 400 mg by mouth at bedtime.    [provider]  diphenhydrAMINE HCl 50 MG/30ML LIQD Take 50  mg by mouth at bedtime. Patient not taking: Reported on 11/03/2022    [provider]  docusate sodium (COLACE) 100 MG capsule Take 1 capsule (100 mg total) by mouth 2 (two) times daily. For constipation 06/19/18   Rai, Ripudeep K, MD  fluticasone (FLONASE) 50 MCG/ACT nasal spray Place 1 spray into both nostrils daily.    [provider]  furosemide (LASIX) 20 MG tablet TAKE ONE TABLET BY MOUTH DAILY AS NEEDED. 09/15/22   Lewayne Bunting, MD  gabapentin (NEURONTIN) 800 MG tablet Take 1 tablet (800 mg total) by mouth 3 (three) times daily. 06/19/18   Rai, Delene Ruffini, MD  HYDROcodone-acetaminophen (NORCO) 7.5-325 MG tablet     [provider]  ibuprofen (ADVIL) 600 MG tablet Take 600 mg by mouth every 6 (six) hours as needed for mild pain, moderate pain or cramping.    [provider]  insulin glargine (LANTUS) 100 unit/mL SOPN Inject 0.15 mLs (15 Units total) into the skin daily. 06/19/18   Rai, Ripudeep K, MD  Insulin Pen Needle 32G X 4 MM MISC Inject 15 Units into the skin daily. And novolog sliding scale 06/19/18   Rai, Delene Ruffini, MD  metFORMIN (GLUCOPHAGE) 1000 MG tablet Take 1 tablet (1,000 mg total) by mouth 2 (two) times daily with a meal. 06/19/18   Rai, Ripudeep K, MD  metoprolol succinate (TOPROL-XL) 50 MG 24 hr tablet Take 1 tablet (50 mg total) by mouth at bedtime. Take with or immediately following a meal. 11/03/22   Camnitz, Andree Coss, MD  metoprolol tartrate (LOPRESSOR) 100 MG tablet TAKE 2 HOURS PRIOR TO CT SCAN 09/22/22   Lewayne Bunting, MD  Omega-3 Fatty Acids (FISH OIL PO) Take 1 capsule by mouth daily.    [provider]  omeprazole (PRILOSEC) 40 MG capsule Take 1 capsule (40 mg total) by mouth daily. 06/19/18   Rai, Ripudeep K, MD  ondansetron (ZOFRAN) 4 MG tablet Take 4 mg by mouth daily.    [provider]  pioglitazone (ACTOS) 30 MG tablet Take 30 mg by mouth daily.    [provider]  polyethylene glycol (MIRALAX)  17 g packet Take 17 g by mouth daily as needed for mild constipation or moderate constipation. 06/19/18   Rai, Ripudeep K, MD  potassium chloride SA (KLOR-CON M) 20 MEQ tablet Take 1 tablet (20 mEq total) by mouth daily. 08/21/22   Charlynne Pander, MD  prazosin (MINIPRESS) 5 MG capsule Take 1 capsule (5 mg total) by mouth at bedtime. 06/19/18   Rai, Delene Ruffini, MD  predniSONE (DELTASONE) 20 MG tablet Take 1 tablet (20 mg total) by mouth daily with breakfast. 03/14/22   Olalere, Adewale A, MD  predniSONE (DELTASONE) 50 MG tablet TAKE 1 TABLET 13 HOURS PRIOR TO CT, 1 TABLET 7 HOURS PRIOR TO CT AND 1 TABLET 1 HOUR PRIOR TO CT 09/22/22   Crenshaw,  Madolyn Frieze, MD  sertraline (ZOLOFT) 100 MG tablet Take 200 mg by mouth daily.    [provider]  Tiotropium Bromide-Olodaterol (STIOLTO RESPIMAT) 2.5-2.5 MCG/ACT AERS Inhale 2 puffs into the lungs daily. 03/14/22   Tomma Lightning, MD  traZODone (DESYREL) 100 MG tablet Take 100 mg by mouth at bedtime.    [provider]    Family History Family History  Problem Relation Age of Onset   Hypertension Mother    Heart attack Father    Diabetes Father     Social History Social History   Tobacco Use   Smoking status: Every Day    Current packs/day: 1.50    Types: Cigarettes   Smokeless tobacco: Never  Vaping Use   Vaping status: Never Used  Substance Use Topics   Alcohol use: Never   Drug use: Not Currently    Types: Cocaine     Allergies   Lisinopril and Contrast media [iodinated contrast media]   Review of Systems Review of Systems As noted in HPI  Physical Exam Triage Vital Signs ED Triage Vitals  Encounter Vitals Group     BP 05/06/23 1246 (!) 152/87     Systolic BP Percentile --      Diastolic BP Percentile --      Pulse Rate 05/06/23 1246 99     Resp 05/06/23 1246 18     Temp 05/06/23 1246 99.1 F (37.3 C)     Temp Source 05/06/23 1246 Oral     SpO2 05/06/23 1246 94 %     Weight 05/06/23 1246 227 lb 1.2 oz  (103 kg)     Height --      Head Circumference --      Peak Flow --      Pain Score 05/06/23 1245 10     Pain Loc --      Pain Education --      Exclude from Growth Chart --    No data found.  Updated Vital Signs BP (!) 152/87 (BP Location: Left Arm)   Pulse 99   Temp 99.1 F (37.3 C) (Oral)   Resp 18   Wt 227 lb 1.2 oz (103 kg)   SpO2 94%   BMI 34.53 kg/m   Visual Acuity Right Eye Distance:   Left Eye Distance:   Bilateral Distance:    Right Eye Near:   Left Eye Near:    Bilateral Near:     Physical Exam Vitals and nursing note reviewed.  Constitutional:      General: He is not in acute distress.    Appearance: He is not toxic-appearing.  HENT:     Right Ear: External ear normal.     Left Ear: External ear normal.     Mouth/Throat:      Comments: His L upper lip is swollen, and has mild swelling of L face.  He is missing a L front upper tooth, but still has a piece under the gum area. The gum area around it is erythematous and swollen.  Eyes:     General: No scleral icterus.    Conjunctiva/sclera: Conjunctivae normal.  Pulmonary:     Effort: Pulmonary effort is normal.  Musculoskeletal:        General: Normal range of motion.     Cervical back: Neck supple.  Lymphadenopathy:     Cervical: No cervical adenopathy.  Skin:    General: Skin is warm and dry.  Neurological:     Mental  Status: He is alert and oriented to person, place, and time.     Gait: Gait normal.  Psychiatric:        Mood and Affect: Mood normal.        Behavior: Behavior normal.        Thought Content: Thought content normal.        Judgment: Judgment normal.      UC Treatments / Results  Labs (all labs ordered are listed, but only abnormal results are displayed) Labs Reviewed - No data to display  EKG   Radiology No results found.  Procedures Procedures (including critical care time)  Medications Ordered in UC Medications - No data to display  Initial Impression /  Assessment and Plan / UC Course  I have reviewed the triage vital signs and the nursing notes.  Dental infection  He was placed on Penicillin as noted Advised to FU with a dentist.  Final Clinical Impressions(s) / UC Diagnoses   Final diagnoses:  Dental abscess   Discharge Instructions   None    ED Prescriptions     Medication Sig Dispense Auth. Provider   penicillin v potassium (VEETID) 500 MG tablet Take 1 tablet (500 mg total) by mouth 4 (four) times daily for 7 days. 28 tablet Rodriguez-Southworth, Nettie Elm, PA-C      PDMP not reviewed this encounter.   Garey Ham, [place] 05/06/23 1312

## 2023-05-06 NOTE — ED Triage Notes (Signed)
 Pt presents with a dental abscess on left upper side of mouth x 1 day. Pt is in severe pain.

## 2023-11-03 ENCOUNTER — Other Ambulatory Visit: Payer: Self-pay | Admitting: Cardiology

## 2023-12-01 ENCOUNTER — Other Ambulatory Visit: Payer: Self-pay | Admitting: Cardiology

## 2023-12-16 ENCOUNTER — Other Ambulatory Visit: Payer: Self-pay | Admitting: Cardiology

## 2024-01-18 ENCOUNTER — Other Ambulatory Visit: Payer: Self-pay | Admitting: Cardiology

## 2024-01-29 ENCOUNTER — Other Ambulatory Visit: Payer: Self-pay | Admitting: Cardiology

## 2024-02-01 NOTE — Telephone Encounter (Signed)
 Left message to call back to discuss further with pt.  (Overdue for follow up)

## 2024-02-01 NOTE — Telephone Encounter (Signed)
 Pt of Dr. Inocencio. Passed 3rd attempt. Does Dr. Inocencio want to refill? Please advise.

## 2024-02-02 NOTE — Telephone Encounter (Signed)
 Called pt and left another message. Called pharmacy and updated them to situation and waiting to discuss with pt before determining refill need.
